# Patient Record
Sex: Female | Born: 1935 | Race: Black or African American | Hispanic: No | Smoking: Former smoker
Health system: Southern US, Community
[De-identification: ages and names within clinical notes are randomized; demographics above are authoritative.]

## PROBLEM LIST (undated history)

## (undated) DIAGNOSIS — K219 Gastro-esophageal reflux disease without esophagitis: Secondary | ICD-10-CM

## (undated) DIAGNOSIS — H409 Unspecified glaucoma: Secondary | ICD-10-CM

## (undated) DIAGNOSIS — I10 Essential (primary) hypertension: Secondary | ICD-10-CM

## (undated) DIAGNOSIS — I4891 Unspecified atrial fibrillation: Secondary | ICD-10-CM

## (undated) DIAGNOSIS — E119 Type 2 diabetes mellitus without complications: Secondary | ICD-10-CM

## (undated) HISTORY — PX: ABDOMINAL HYSTERECTOMY: SHX81

## (undated) HISTORY — DX: Type 2 diabetes mellitus without complications: E11.9

## (undated) HISTORY — PX: HEMORRHOID SURGERY: SHX153

---

## 2004-05-23 ENCOUNTER — Ambulatory Visit: Payer: Self-pay | Admitting: Internal Medicine

## 2005-09-11 ENCOUNTER — Ambulatory Visit: Payer: Self-pay | Admitting: Internal Medicine

## 2006-09-18 ENCOUNTER — Ambulatory Visit: Payer: Self-pay | Admitting: Internal Medicine

## 2008-02-19 ENCOUNTER — Ambulatory Visit: Payer: Self-pay | Admitting: Internal Medicine

## 2009-02-28 ENCOUNTER — Ambulatory Visit: Payer: Self-pay | Admitting: Internal Medicine

## 2009-04-28 ENCOUNTER — Ambulatory Visit: Payer: Self-pay | Admitting: Ophthalmology

## 2009-05-11 ENCOUNTER — Ambulatory Visit: Payer: Self-pay | Admitting: Cardiology

## 2009-05-11 ENCOUNTER — Ambulatory Visit: Payer: Self-pay | Admitting: Ophthalmology

## 2010-03-01 ENCOUNTER — Ambulatory Visit: Payer: Self-pay | Admitting: Internal Medicine

## 2011-03-05 ENCOUNTER — Ambulatory Visit: Payer: Self-pay | Admitting: Internal Medicine

## 2012-04-30 ENCOUNTER — Ambulatory Visit: Payer: Self-pay | Admitting: Internal Medicine

## 2012-12-31 ENCOUNTER — Encounter: Payer: Self-pay | Admitting: Podiatry

## 2012-12-31 ENCOUNTER — Ambulatory Visit (INDEPENDENT_AMBULATORY_CARE_PROVIDER_SITE_OTHER): Payer: Medicare Other | Admitting: Podiatry

## 2012-12-31 VITALS — BP 160/90 | HR 111 | Resp 16 | Ht 61.0 in | Wt 154.0 lb

## 2012-12-31 DIAGNOSIS — M79609 Pain in unspecified limb: Secondary | ICD-10-CM

## 2012-12-31 DIAGNOSIS — B351 Tinea unguium: Secondary | ICD-10-CM

## 2012-12-31 NOTE — Progress Notes (Signed)
Chief complaint of painful nails 1 through 5 bilateral.  Objective: Vital signs are stable alert and oriented x3. Nails are thick yellow dystrophic clinically mycotic. All reactive hyperkeratosis plantar aspect of the bilateral foot.  Assessment: Pain in limb secondary to onychomycosis porokeratotic lesions also noted.  Plan: Debridement of all reactive hyperkeratosis and debridement of nails 1 through 5 bilateral is cover service secondary to pain.

## 2013-03-30 ENCOUNTER — Encounter: Payer: Self-pay | Admitting: Podiatry

## 2013-03-30 ENCOUNTER — Ambulatory Visit (INDEPENDENT_AMBULATORY_CARE_PROVIDER_SITE_OTHER): Payer: Medicare Other | Admitting: Podiatry

## 2013-03-30 VITALS — BP 137/87 | HR 87 | Resp 18

## 2013-03-30 DIAGNOSIS — M79609 Pain in unspecified limb: Secondary | ICD-10-CM

## 2013-03-30 DIAGNOSIS — B351 Tinea unguium: Secondary | ICD-10-CM

## 2013-03-30 DIAGNOSIS — Q828 Other specified congenital malformations of skin: Secondary | ICD-10-CM

## 2013-03-30 DIAGNOSIS — E119 Type 2 diabetes mellitus without complications: Secondary | ICD-10-CM

## 2013-03-30 NOTE — Progress Notes (Signed)
Nails and callus on the right foot.  Objective: Vital signs are stable she is alert and oriented x3. Her nails are thick yellow dystrophic onychomycotic painful palpation as well as debridement she also has a porokeratotic lesion plantar aspect of the right foot. There is no overlying erythema edema saline is drainage or odor.  Assessment: Non-insulin-dependent diabetes mellitus with a history of porokeratotic lesion right foot. Pain in limb secondary to onychomycosis 1 through 5 bilateral.  Plan: Debridement of nails 1 through 5 bilateral covered service secondary to pain.

## 2013-04-01 ENCOUNTER — Ambulatory Visit: Payer: Medicare Other | Admitting: Podiatry

## 2013-06-29 ENCOUNTER — Ambulatory Visit (INDEPENDENT_AMBULATORY_CARE_PROVIDER_SITE_OTHER): Payer: Medicare Other | Admitting: Podiatry

## 2013-06-29 VITALS — BP 143/82 | HR 103 | Resp 16

## 2013-06-29 DIAGNOSIS — Q828 Other specified congenital malformations of skin: Secondary | ICD-10-CM

## 2013-06-29 DIAGNOSIS — M79609 Pain in unspecified limb: Secondary | ICD-10-CM

## 2013-06-29 DIAGNOSIS — E119 Type 2 diabetes mellitus without complications: Secondary | ICD-10-CM

## 2013-06-29 DIAGNOSIS — B351 Tinea unguium: Secondary | ICD-10-CM

## 2013-06-29 NOTE — Progress Notes (Signed)
She presents today chief complaint of painful elongated toenails and calluses bilateral.  Objective: Pulses are palpable bilateral. Reactive hyperkeratosis plantar aspect the bilateral foot nails are thick yellow dystrophic onychomycotic and painful palpation.  Assessment: Pain in limb secondary to onychomycosis and calluses.  Plan: Debridement of all reactive hyperkeratosis and debridement of nails 1 through 5 bilateral covered service secondary to pain.

## 2013-09-28 ENCOUNTER — Ambulatory Visit (INDEPENDENT_AMBULATORY_CARE_PROVIDER_SITE_OTHER): Payer: Medicare Other | Admitting: Podiatry

## 2013-09-28 DIAGNOSIS — B351 Tinea unguium: Secondary | ICD-10-CM

## 2013-09-28 DIAGNOSIS — M79676 Pain in unspecified toe(s): Secondary | ICD-10-CM

## 2013-09-28 DIAGNOSIS — E119 Type 2 diabetes mellitus without complications: Secondary | ICD-10-CM

## 2013-09-28 DIAGNOSIS — Q828 Other specified congenital malformations of skin: Secondary | ICD-10-CM

## 2013-09-28 DIAGNOSIS — M79609 Pain in unspecified limb: Secondary | ICD-10-CM

## 2013-09-28 NOTE — Progress Notes (Signed)
She presents today with a chief complaint of painful elongated toenails as well as corns and calluses. ° °Objective: Vital signs are stable she is alert and oriented x3. Nails are thick yellow dystrophic onychomycotic painful palpation. Thick porokeratotic lesions plantar aspect of the forefoot bilaterally. ° °Assessment: History of painful elongated nails 1 through 5 I lateral do to onychomycosis. And porokeratosis. ° °Plan: Debridement of nails in thickness and length as cover service secondary to pain. Debridement all reactive hyperkeratosis secondary to pain. °

## 2013-12-28 ENCOUNTER — Ambulatory Visit (INDEPENDENT_AMBULATORY_CARE_PROVIDER_SITE_OTHER): Payer: Medicare Other | Admitting: Podiatry

## 2013-12-28 ENCOUNTER — Ambulatory Visit: Payer: Medicare Other | Admitting: Podiatry

## 2013-12-28 DIAGNOSIS — B351 Tinea unguium: Secondary | ICD-10-CM

## 2013-12-28 DIAGNOSIS — M79676 Pain in unspecified toe(s): Secondary | ICD-10-CM

## 2013-12-28 NOTE — Progress Notes (Signed)
She presents today with a chief complaint of painful elongated toenails as well as corns and calluses.  Objective: Vital signs are stable she is alert and oriented x3. Nails are thick yellow dystrophic onychomycotic painful palpation. Thick porokeratotic lesions plantar aspect of the forefoot bilaterally.  Assessment: History of painful elongated nails 1 through 5 I lateral do to onychomycosis. And porokeratosis.  Plan: Debridement of nails in thickness and length as cover service secondary to pain. Debridement all reactive hyperkeratosis secondary to pain.

## 2014-02-26 ENCOUNTER — Ambulatory Visit: Payer: Self-pay | Admitting: Internal Medicine

## 2014-03-29 ENCOUNTER — Ambulatory Visit (INDEPENDENT_AMBULATORY_CARE_PROVIDER_SITE_OTHER): Payer: Medicare Other | Admitting: Podiatry

## 2014-03-29 ENCOUNTER — Other Ambulatory Visit: Payer: Medicare Other

## 2014-03-29 DIAGNOSIS — M79676 Pain in unspecified toe(s): Secondary | ICD-10-CM | POA: Diagnosis not present

## 2014-03-29 DIAGNOSIS — B351 Tinea unguium: Secondary | ICD-10-CM

## 2014-03-29 NOTE — Progress Notes (Signed)
Presents today chief complaint of painful elongated toenails.  Objective: Pulses are palpable bilateral nails are thick, yellow dystrophic onychomycosis and painful palpation.   Assessment: Onychomycosis with pain in limb.  Plan: Treatment of nails in thickness and length as covered service secondary to pain.  

## 2014-07-12 ENCOUNTER — Ambulatory Visit (INDEPENDENT_AMBULATORY_CARE_PROVIDER_SITE_OTHER): Payer: Medicare Other | Admitting: Podiatry

## 2014-07-12 DIAGNOSIS — M79676 Pain in unspecified toe(s): Secondary | ICD-10-CM

## 2014-07-12 DIAGNOSIS — B351 Tinea unguium: Secondary | ICD-10-CM | POA: Diagnosis not present

## 2014-07-12 DIAGNOSIS — Q828 Other specified congenital malformations of skin: Secondary | ICD-10-CM | POA: Diagnosis not present

## 2014-07-12 LAB — HM DIABETES FOOT EXAM

## 2014-07-12 NOTE — Progress Notes (Signed)
She presents today with a chief complaint of painful elongated toenails as well as corns and calluses.  Objective: Vital signs are stable she is alert and oriented x3. Nails are thick yellow dystrophic onychomycotic painful palpation. Thick porokeratotic lesions plantar aspect of the forefoot bilaterally.  Assessment: History of painful elongated nails 1 through 5 I lateral do to onychomycosis. And porokeratosis.  Plan: Debridement of nails in thickness and length as cover service secondary to pain. Debridement all reactive hyperkeratosis R sub 2 area secondary to pain.

## 2014-10-12 ENCOUNTER — Ambulatory Visit: Payer: Medicare Other

## 2014-10-18 ENCOUNTER — Ambulatory Visit (INDEPENDENT_AMBULATORY_CARE_PROVIDER_SITE_OTHER): Payer: Medicare Other | Admitting: Podiatry

## 2014-10-18 DIAGNOSIS — M79676 Pain in unspecified toe(s): Secondary | ICD-10-CM | POA: Diagnosis not present

## 2014-10-18 DIAGNOSIS — B351 Tinea unguium: Secondary | ICD-10-CM

## 2014-10-18 NOTE — Progress Notes (Signed)
She presents today with a chief complaint of painful elongated toenails as well as corns and calluses.  Objective: Vital signs are stable she is alert and oriented x3. Nails are thick yellow dystrophic onychomycotic painful palpation. Thick porokeratotic lesions plantar aspect of the forefoot bilaterally.  Assessment: History of painful elongated nails 1 through 5 I lateral do to onychomycosis. And porokeratosis.  Plan: Debridement of nails in thickness and length as cover service secondary to pain. Debridement all reactive hyperkeratosis R sub 2 area secondary to pain.  Arbutus Ped DPM

## 2015-01-19 ENCOUNTER — Encounter: Payer: Self-pay | Admitting: Podiatry

## 2015-01-19 ENCOUNTER — Ambulatory Visit (INDEPENDENT_AMBULATORY_CARE_PROVIDER_SITE_OTHER): Payer: Medicare Other | Admitting: Podiatry

## 2015-01-19 DIAGNOSIS — M79676 Pain in unspecified toe(s): Secondary | ICD-10-CM

## 2015-01-19 DIAGNOSIS — B351 Tinea unguium: Secondary | ICD-10-CM | POA: Diagnosis not present

## 2015-01-19 DIAGNOSIS — Q828 Other specified congenital malformations of skin: Secondary | ICD-10-CM | POA: Diagnosis not present

## 2015-01-19 NOTE — Progress Notes (Signed)
She presents today with a chief complaint painful elongated toenails.  Objective: Vital signs are stable alert and oriented 3. Pulses are strongly palpable. Toenails are thick yellow dystrophic, mycotic and painful palpation.  Assessment: Pain in limb secondary to onychomycosis and porokeratosis.  Plan: Debridement of all reactive hyperkeratotic tissue and debridement nails 1 through 5 bilateral.

## 2015-04-20 ENCOUNTER — Encounter: Payer: Self-pay | Admitting: Podiatry

## 2015-04-20 ENCOUNTER — Ambulatory Visit (INDEPENDENT_AMBULATORY_CARE_PROVIDER_SITE_OTHER): Payer: Medicare Other | Admitting: Podiatry

## 2015-04-20 DIAGNOSIS — B351 Tinea unguium: Secondary | ICD-10-CM

## 2015-04-20 DIAGNOSIS — M79676 Pain in unspecified toe(s): Secondary | ICD-10-CM

## 2015-04-20 DIAGNOSIS — Q828 Other specified congenital malformations of skin: Secondary | ICD-10-CM | POA: Diagnosis not present

## 2015-04-20 NOTE — Progress Notes (Signed)
She presents today for diabetic check. She states that her toenails and calluses are starting to get long and painful and she would like to have them cut.  Objective: Signs are stable she is alert and oriented 3. Pulses are palpable. Toenails are thick elongated and painful. Sharply incurvated nail margins. She has distal clavi in 4 keratomas bilateral.  Assessment: Porokeratosis bilateral. Pain in limb secondary to onychomycosis and diabetes.  Plan: Debridement of toenails and reactive hyperkeratosis bilateral. I will follow-up with her in 2-3 months.

## 2015-05-24 ENCOUNTER — Other Ambulatory Visit: Payer: Self-pay | Admitting: Internal Medicine

## 2015-05-24 DIAGNOSIS — Z1231 Encounter for screening mammogram for malignant neoplasm of breast: Secondary | ICD-10-CM

## 2015-06-02 ENCOUNTER — Ambulatory Visit
Admission: RE | Admit: 2015-06-02 | Discharge: 2015-06-02 | Disposition: A | Discharge status: Home / Self Care | Payer: Medicare Other | Source: Ambulatory Visit | Attending: Internal Medicine | Admitting: Internal Medicine

## 2015-06-02 ENCOUNTER — Other Ambulatory Visit: Payer: Self-pay | Admitting: Internal Medicine

## 2015-06-02 DIAGNOSIS — Z1231 Encounter for screening mammogram for malignant neoplasm of breast: Secondary | ICD-10-CM

## 2015-07-20 ENCOUNTER — Encounter: Payer: Self-pay | Admitting: Podiatry

## 2015-07-20 ENCOUNTER — Ambulatory Visit (INDEPENDENT_AMBULATORY_CARE_PROVIDER_SITE_OTHER): Payer: Medicare Other | Admitting: Podiatry

## 2015-07-20 DIAGNOSIS — M79676 Pain in unspecified toe(s): Secondary | ICD-10-CM

## 2015-07-20 DIAGNOSIS — B351 Tinea unguium: Secondary | ICD-10-CM

## 2015-07-20 DIAGNOSIS — Q828 Other specified congenital malformations of skin: Secondary | ICD-10-CM

## 2015-07-20 NOTE — Progress Notes (Signed)
She presents today with a chief complaint of a painful porokeratotic plantar aspect of the forefoot right as well as elongated thickened mycotic painful nails.  Objective: Vital signs are stable pulses are strong and palpable bilateral porokeratotic plantar aspect of the right foot is painful on palpation there is no open wound or lesion. Toenails are thick yellow dystrophic onychomycotic.  Assessment: Pain limb secondary to onychomycosis porokeratosis.  Plan: Debridement of toenails and porokeratosis.

## 2015-10-24 ENCOUNTER — Ambulatory Visit (INDEPENDENT_AMBULATORY_CARE_PROVIDER_SITE_OTHER): Payer: Medicare Other | Admitting: Podiatry

## 2015-10-24 ENCOUNTER — Encounter: Payer: Self-pay | Admitting: Podiatry

## 2015-10-24 DIAGNOSIS — Q828 Other specified congenital malformations of skin: Secondary | ICD-10-CM

## 2015-10-24 DIAGNOSIS — M79676 Pain in unspecified toe(s): Secondary | ICD-10-CM | POA: Diagnosis not present

## 2015-10-24 DIAGNOSIS — B351 Tinea unguium: Secondary | ICD-10-CM

## 2015-10-25 NOTE — Progress Notes (Signed)
She presents today with a chief complaint of painful elongated toenails and his history diabetes. She states she is doing really well. She is also complaining of painful calluses on aspect bilateral foot.  Objective: Vital signs are stable she is alert and oriented 3. Pulses are palpable. Neurologic sensorium is intact. Deep tendon reflexes are intact. Her toenails are thick yellow dystrophic mycotic and painful palpation. Multiple porokeratotic lesions plantar aspect of bilateral foot medial aspect of the hallux.  Assessment: Pain in limb secondary to onychomycosis and diabetes. Porokeratosis with no open lesions.  Plan: Debridement of toenails 1 through 5 bilateral. Follow up with her in 3 months. Debridement of all reactive hyperkeratotic tissue.

## 2016-01-25 ENCOUNTER — Encounter: Payer: Self-pay | Admitting: Podiatry

## 2016-01-25 ENCOUNTER — Ambulatory Visit (INDEPENDENT_AMBULATORY_CARE_PROVIDER_SITE_OTHER): Payer: Medicare Other | Admitting: Podiatry

## 2016-01-25 DIAGNOSIS — Q828 Other specified congenital malformations of skin: Secondary | ICD-10-CM

## 2016-01-25 DIAGNOSIS — B351 Tinea unguium: Secondary | ICD-10-CM | POA: Diagnosis not present

## 2016-01-25 DIAGNOSIS — M79676 Pain in unspecified toe(s): Secondary | ICD-10-CM

## 2016-01-25 NOTE — Progress Notes (Signed)
She presents today chief complaint of painful elongated toenails and corns and calluses.  Objective: Vital signs are stable she is alert and oriented 3. Pulses are palpable. Toenails are thick yellow dystrophic onychomycotic painful palpation. Multiple porokeratotic lesions plantar aspect of the bilateral foot. No open lesions or wounds.  Assessment: Pain in limb secondary to onychomycosis 1 through 5 bilateral.  Plan: Debridement of toenails 1 through 5 bilateral covered service secondary to pain debridement of all reactive hyperkeratotic tissue.

## 2016-04-25 ENCOUNTER — Ambulatory Visit: Payer: Medicare Other | Admitting: Podiatry

## 2016-04-30 ENCOUNTER — Ambulatory Visit: Payer: Medicare Other | Admitting: Podiatry

## 2016-10-23 ENCOUNTER — Other Ambulatory Visit: Payer: Self-pay | Admitting: Internal Medicine

## 2016-10-23 DIAGNOSIS — Z1231 Encounter for screening mammogram for malignant neoplasm of breast: Secondary | ICD-10-CM

## 2016-11-13 ENCOUNTER — Ambulatory Visit
Admission: RE | Admit: 2016-11-13 | Discharge: 2016-11-13 | Disposition: A | Discharge status: Home / Self Care | Payer: Medicare Other | Source: Ambulatory Visit | Attending: Internal Medicine | Admitting: Internal Medicine

## 2016-11-13 DIAGNOSIS — Z1231 Encounter for screening mammogram for malignant neoplasm of breast: Secondary | ICD-10-CM | POA: Diagnosis not present

## 2017-02-28 ENCOUNTER — Ambulatory Visit (INDEPENDENT_AMBULATORY_CARE_PROVIDER_SITE_OTHER): Payer: Medicare Other | Admitting: Podiatry

## 2017-02-28 ENCOUNTER — Encounter: Payer: Self-pay | Admitting: Podiatry

## 2017-02-28 DIAGNOSIS — B351 Tinea unguium: Secondary | ICD-10-CM | POA: Diagnosis not present

## 2017-02-28 DIAGNOSIS — Q828 Other specified congenital malformations of skin: Secondary | ICD-10-CM | POA: Diagnosis not present

## 2017-02-28 DIAGNOSIS — M79676 Pain in unspecified toe(s): Secondary | ICD-10-CM

## 2017-02-28 NOTE — Progress Notes (Addendum)
Complaint:  Visit Type: Patient returns to my office for continued preventative foot care services. Complaint: Patient states" my nails have grown long and thick and become painful to walk and wear shoes" Patient has been diagnosed with DM with no foot complications. The patient presents for preventative foot care services. No changes to ROS  Podiatric Exam: Vascular: dorsalis pedis and posterior tibial pulses are palpable bilateral. Capillary return is immediate. Temperature gradient is WNL. Skin turgor WNL  Sensorium: Normal Semmes Weinstein monofilament test. Normal tactile sensation bilaterally. Nail Exam: Pt has thick disfigured discolored nails with subungual debris noted bilateral entire nail hallux through fifth toenails Ulcer Exam: There is no evidence of ulcer or pre-ulcerative changes or infection. Orthopedic Exam: Muscle tone and strength are WNL. No limitations in general ROM. No crepitus or effusions noted. Foot type and digits show no abnormalities. HAV  B/L. Skin:  Porokeratosis sub 2 right.. No infection or ulcers  Diagnosis:  Onychomycosis, , Pain in right toe, pain in left toes  Treatment & Plan Procedures and Treatment: Consent by patient was obtained for treatment procedures.   Debridement of mycotic and hypertrophic toenails, 1 through 5 bilateral and clearing of subungual debris. No ulceration, no infection noted.  Return Visit-Office Procedure: Patient instructed to return to the office for a follow up visit 4 months for continued evaluation and treatment.    Helane GuntherGregory Siniyah Evangelist DPM

## 2017-07-01 ENCOUNTER — Ambulatory Visit: Payer: Medicare Other | Admitting: Podiatry

## 2018-01-28 ENCOUNTER — Other Ambulatory Visit: Payer: Self-pay | Admitting: Internal Medicine

## 2018-01-28 DIAGNOSIS — Z1231 Encounter for screening mammogram for malignant neoplasm of breast: Secondary | ICD-10-CM

## 2018-02-13 ENCOUNTER — Ambulatory Visit
Admission: RE | Admit: 2018-02-13 | Discharge: 2018-02-13 | Disposition: A | Discharge status: Home / Self Care | Payer: Medicare Other | Source: Ambulatory Visit | Attending: Internal Medicine | Admitting: Internal Medicine

## 2018-02-13 DIAGNOSIS — Z1231 Encounter for screening mammogram for malignant neoplasm of breast: Secondary | ICD-10-CM | POA: Diagnosis present

## 2020-06-03 ENCOUNTER — Inpatient Hospital Stay
Admission: EM | Admit: 2020-06-03 | Discharge: 2020-06-06 | DRG: 690 | Disposition: A | Discharge status: Skilled Nursing Facility | Payer: Medicare Other | Attending: Internal Medicine | Admitting: Internal Medicine

## 2020-06-03 ENCOUNTER — Other Ambulatory Visit: Payer: Self-pay

## 2020-06-03 DIAGNOSIS — E785 Hyperlipidemia, unspecified: Secondary | ICD-10-CM | POA: Diagnosis present

## 2020-06-03 DIAGNOSIS — R1310 Dysphagia, unspecified: Secondary | ICD-10-CM | POA: Diagnosis present

## 2020-06-03 DIAGNOSIS — D509 Iron deficiency anemia, unspecified: Secondary | ICD-10-CM | POA: Diagnosis present

## 2020-06-03 DIAGNOSIS — Z79899 Other long term (current) drug therapy: Secondary | ICD-10-CM

## 2020-06-03 DIAGNOSIS — Z66 Do not resuscitate: Secondary | ICD-10-CM | POA: Diagnosis present

## 2020-06-03 DIAGNOSIS — R531 Weakness: Secondary | ICD-10-CM | POA: Diagnosis not present

## 2020-06-03 DIAGNOSIS — N182 Chronic kidney disease, stage 2 (mild): Secondary | ICD-10-CM | POA: Diagnosis present

## 2020-06-03 DIAGNOSIS — I1310 Hypertensive heart and chronic kidney disease without heart failure, with stage 1 through stage 4 chronic kidney disease, or unspecified chronic kidney disease: Secondary | ICD-10-CM | POA: Diagnosis present

## 2020-06-03 DIAGNOSIS — I4510 Unspecified right bundle-branch block: Secondary | ICD-10-CM | POA: Diagnosis present

## 2020-06-03 DIAGNOSIS — E876 Hypokalemia: Secondary | ICD-10-CM | POA: Diagnosis present

## 2020-06-03 DIAGNOSIS — K219 Gastro-esophageal reflux disease without esophagitis: Secondary | ICD-10-CM | POA: Diagnosis present

## 2020-06-03 DIAGNOSIS — E1122 Type 2 diabetes mellitus with diabetic chronic kidney disease: Secondary | ICD-10-CM | POA: Diagnosis present

## 2020-06-03 DIAGNOSIS — R197 Diarrhea, unspecified: Secondary | ICD-10-CM | POA: Diagnosis present

## 2020-06-03 DIAGNOSIS — N179 Acute kidney failure, unspecified: Secondary | ICD-10-CM | POA: Diagnosis present

## 2020-06-03 DIAGNOSIS — N390 Urinary tract infection, site not specified: Secondary | ICD-10-CM | POA: Diagnosis not present

## 2020-06-03 DIAGNOSIS — E860 Dehydration: Secondary | ICD-10-CM | POA: Diagnosis present

## 2020-06-03 DIAGNOSIS — Z7984 Long term (current) use of oral hypoglycemic drugs: Secondary | ICD-10-CM

## 2020-06-03 DIAGNOSIS — Z20822 Contact with and (suspected) exposure to covid-19: Secondary | ICD-10-CM | POA: Diagnosis present

## 2020-06-03 DIAGNOSIS — H409 Unspecified glaucoma: Secondary | ICD-10-CM | POA: Diagnosis present

## 2020-06-03 DIAGNOSIS — Z90710 Acquired absence of both cervix and uterus: Secondary | ICD-10-CM

## 2020-06-03 DIAGNOSIS — I4891 Unspecified atrial fibrillation: Secondary | ICD-10-CM | POA: Diagnosis present

## 2020-06-03 DIAGNOSIS — R262 Difficulty in walking, not elsewhere classified: Secondary | ICD-10-CM | POA: Diagnosis present

## 2020-06-03 HISTORY — DX: Gastro-esophageal reflux disease without esophagitis: K21.9

## 2020-06-03 HISTORY — DX: Unspecified atrial fibrillation: I48.91

## 2020-06-03 HISTORY — DX: Essential (primary) hypertension: I10

## 2020-06-03 HISTORY — DX: Unspecified glaucoma: H40.9

## 2020-06-03 LAB — CBC
HCT: 27.6 % — ABNORMAL LOW (ref 36.0–46.0)
Hemoglobin: 9.6 g/dL — ABNORMAL LOW (ref 12.0–15.0)
MCH: 27.7 pg (ref 26.0–34.0)
MCHC: 34.8 g/dL (ref 30.0–36.0)
MCV: 79.5 fL — ABNORMAL LOW (ref 80.0–100.0)
Platelets: 280 10*3/uL (ref 150–400)
RBC: 3.47 MIL/uL — ABNORMAL LOW (ref 3.87–5.11)
RDW: 14.9 % (ref 11.5–15.5)
WBC: 11.9 10*3/uL — ABNORMAL HIGH (ref 4.0–10.5)
nRBC: 0 % (ref 0.0–0.2)

## 2020-06-03 LAB — URINALYSIS, COMPLETE (UACMP) WITH MICROSCOPIC
Bilirubin Urine: NEGATIVE
Glucose, UA: NEGATIVE mg/dL
Ketones, ur: NEGATIVE mg/dL
Nitrite: NEGATIVE
Protein, ur: NEGATIVE mg/dL
Specific Gravity, Urine: 1.013 (ref 1.005–1.030)
WBC, UA: >50 WBC/hpf — ABNORMAL HIGH (ref 0–5)
pH: 5 (ref 5.0–8.0)

## 2020-06-03 LAB — BASIC METABOLIC PANEL
Anion gap: 9 (ref 5–15)
BUN: 30 mg/dL — ABNORMAL HIGH (ref 8–23)
CO2: 25 mmol/L (ref 22–32)
Calcium: 9.1 mg/dL (ref 8.9–10.3)
Chloride: 107 mmol/L (ref 98–111)
Creatinine, Ser: 1.54 mg/dL — ABNORMAL HIGH (ref 0.44–1.00)
GFR, Estimated: 32 mL/min — ABNORMAL LOW (ref >60)
Glucose, Bld: 107 mg/dL — ABNORMAL HIGH (ref 70–99)
Potassium: 3.4 mmol/L — ABNORMAL LOW (ref 3.5–5.1)
Sodium: 141 mmol/L (ref 135–145)

## 2020-06-03 LAB — TROPONIN I (HIGH SENSITIVITY): Troponin I (High Sensitivity): 21 ng/L — ABNORMAL HIGH (ref <18)

## 2020-06-03 MED ORDER — CEPHALEXIN 500 MG PO CAPS: 500.0000 mg | ORAL_CAPSULE | Freq: Four times a day (QID) | ORAL | 0 refills | Status: DC

## 2020-06-03 MED ORDER — SODIUM CHLORIDE 0.9 % IV BOLUS
500.0000 mL | Freq: Once | INTRAVENOUS | Status: AC
  Administered 2020-06-03: 500 mL via INTRAVENOUS

## 2020-06-03 MED ORDER — SODIUM CHLORIDE 0.9 % IV SOLN
1.0000 g | Freq: Once | INTRAVENOUS | Status: AC
  Administered 2020-06-03: 1 g via INTRAVENOUS
  Filled 2020-06-03: qty 10

## 2020-06-03 NOTE — ED Notes (Signed)
Ex blue/red sent to lab. pericare provided for dried stool on buttocks and bilateral legs

## 2020-06-03 NOTE — ED Provider Notes (Signed)
[facility] Emergency Department Provider Note  ____________________________________________   I have reviewed the triage vital signs and the nursing notes.   HISTORY  Chief Complaint Weakness   History limited by: Not Limited   HPI Ebony Torres is a 87 y.o. female who presents to the emergency department today because of concern for weakness. The patient states that when she went to get out of bed today she was worried that she would fall. She has had weakness in the past and has had a fall roughly 3 months ago. Was not seen at that time. Son who is at bedside states that he feels she has not been doing well for a little while. States that she does not eat or drink enough. The patient denies any unilateral weakness. Denies any chest pain or shortness of breath. Denies any urinary changes or fever.     Records reviewed. Per medical record review patient has a history of atrial fibrillation, diabetes, GERD.   Past Medical History:  Diagnosis Date  . Atrial fibrillation (HCC)   . Diabetes (HCC)   . GERD (gastroesophageal reflux disease)   . Glaucoma   . Hypertension     There are no problems to display for this patient.   Past Surgical History:  Procedure Laterality Date  . ABDOMINAL HYSTERECTOMY    . HEMORRHOID SURGERY      Prior to Admission medications   Medication Sig Start Date End Date Taking? Authorizing Provider  metFORMIN (GLUMETZA) 1000 MG (MOD) 24 hr tablet Take 1,000 mg by mouth 2 (two) times daily with a meal.    [provider]  Multiple Vitamins-Minerals (MULTIVITAMIN PO) Take by mouth daily.    [provider]  Travoprost (TRAVATAN Z OP) Apply to eye. One drop at bedtime in each eye    [provider]    Allergies Patient has no known allergies.  Family History  Problem Relation Age of Onset  . Breast cancer Neg Hx     Social History Social History   Tobacco Use  . Smoking status: Never  Smoker  . Smokeless tobacco: Never Used  Substance Use Topics  . Alcohol use: No    Review of Systems Constitutional: Positive for generalized weakness.  Eyes: No visual changes. ENT: No sore throat. Cardiovascular: Denies chest pain. Respiratory: Denies shortness of breath. Gastrointestinal: No abdominal pain.  No nausea, no vomiting.  No diarrhea.   Genitourinary: Negative for dysuria. Musculoskeletal: Negative for back pain. Skin: Negative for rash. Neurological: Negative for headaches, focal weakness or numbness.  ____________________________________________   PHYSICAL EXAM:  VITAL SIGNS: ED Triage Vitals  Enc Vitals Group     BP 06/03/20 2003 (!) 161/78     Pulse Rate 06/03/20 2003 97     Resp 06/03/20 2003 16     Temp 06/03/20 2003 99.2 F (37.3 C)     Temp Source 06/03/20 2003 Oral     SpO2 06/03/20 2003 100 %     Weight 06/03/20 1959 154 lb 5.2 oz (70 kg)     Height 06/03/20 1959 5\' 1"  (1.549 m)     Head Circumference --      Peak Flow --      Pain Score 06/03/20 1958 0   Constitutional: Alert and oriented.  Eyes: Conjunctivae are normal.  ENT      Head: Normocephalic and atraumatic.      Nose: No congestion/rhinnorhea.      Mouth/Throat: Mucous membranes are moist.  Neck: No stridor. Hematological/Lymphatic/Immunilogical: No cervical lymphadenopathy. Cardiovascular: Normal rate, regular rhythm.  No murmurs, rubs, or gallops.  Respiratory: Normal respiratory effort without tachypnea nor retractions. Breath sounds are clear and equal bilaterally. No wheezes/rales/rhonchi. Gastrointestinal: Soft and non tender. No rebound. No guarding.  Genitourinary: Deferred Musculoskeletal: Normal range of motion in all extremities. No lower extremity edema. Neurologic:  Normal speech and language. No gross focal neurologic deficits are appreciated.  Skin:  Skin is warm, dry and intact. No rash noted. Psychiatric: Mood and affect are normal. Speech and behavior are  normal. Patient exhibits appropriate insight and judgment.  ____________________________________________    LABS (pertinent positives/negatives)  BMP na 141, k 3.4, glu 107, cr 1.54 CBC wbc 11.9, hgb 9.6, plt 280 Trop hs 21 UA cloudy, moderate hgb dipstick, leukocytes large, >50 wbc, many bacteria ____________________________________________   EKG  I, Phineas Semen, attending physician, personally viewed and interpreted this EKG  EKG Time: 2002 Rate: 95 Rhythm: sinus rhythm Axis: right axis deviation Intervals: qtc 503 QRS: RBBB ST changes: no st elevation Impression: abnormal ekg   ____________________________________________    RADIOLOGY  None  ____________________________________________   PROCEDURES  Procedures  ____________________________________________   INITIAL IMPRESSION / ASSESSMENT AND PLAN / ED COURSE  Pertinent labs & imaging results that were available during my care of the patient were reviewed by me and considered in my medical decision making (see chart for details).   Patient presented to the emergency department today because of concern for weakness. Work up here is concerning for urinary tract infection. Initial troponin was minimally elevated. EKG without concerning acute findings. Patient was given IVFs here in the emergency department and dose of IV abx. Will get repeat troponin and attempt ambulation after iv fluids.   ____________________________________________   FINAL CLINICAL IMPRESSION(S) / ED DIAGNOSES  Final diagnoses:  Weakness  Lower urinary tract infectious disease     Note: This dictation was prepared with Dragon dictation. Any transcriptional errors that result from this process are unintentional     Phineas Semen, MD 06/03/20 2334

## 2020-06-03 NOTE — ED Notes (Signed)
Ginger ale provided.

## 2020-06-03 NOTE — Discharge Instructions (Addendum)
Please seek medical attention for any high fevers, chest pain, shortness of breath, change in behavior, persistent vomiting, bloody stool or any other new or concerning symptoms.  

## 2020-06-03 NOTE — ED Triage Notes (Signed)
Pt c/o weakness x years, states was attempting to go to bathroom this morning and was too weak to make it. States has been to pmd couple months ago for eval and hasnt been told an eval. Pt denies pain. Was taken off diabetes med last year

## 2020-06-04 ENCOUNTER — Other Ambulatory Visit: Payer: Self-pay

## 2020-06-04 DIAGNOSIS — Z20822 Contact with and (suspected) exposure to covid-19: Secondary | ICD-10-CM | POA: Diagnosis present

## 2020-06-04 DIAGNOSIS — Z79899 Other long term (current) drug therapy: Secondary | ICD-10-CM | POA: Diagnosis not present

## 2020-06-04 DIAGNOSIS — R531 Weakness: Secondary | ICD-10-CM

## 2020-06-04 DIAGNOSIS — Z90710 Acquired absence of both cervix and uterus: Secondary | ICD-10-CM | POA: Diagnosis not present

## 2020-06-04 DIAGNOSIS — N390 Urinary tract infection, site not specified: Secondary | ICD-10-CM | POA: Diagnosis present

## 2020-06-04 DIAGNOSIS — E785 Hyperlipidemia, unspecified: Secondary | ICD-10-CM | POA: Diagnosis present

## 2020-06-04 DIAGNOSIS — H409 Unspecified glaucoma: Secondary | ICD-10-CM | POA: Diagnosis present

## 2020-06-04 DIAGNOSIS — Z7984 Long term (current) use of oral hypoglycemic drugs: Secondary | ICD-10-CM | POA: Diagnosis not present

## 2020-06-04 DIAGNOSIS — I4891 Unspecified atrial fibrillation: Secondary | ICD-10-CM | POA: Diagnosis present

## 2020-06-04 DIAGNOSIS — N189 Chronic kidney disease, unspecified: Secondary | ICD-10-CM

## 2020-06-04 DIAGNOSIS — R1310 Dysphagia, unspecified: Secondary | ICD-10-CM | POA: Diagnosis present

## 2020-06-04 DIAGNOSIS — R262 Difficulty in walking, not elsewhere classified: Secondary | ICD-10-CM | POA: Diagnosis not present

## 2020-06-04 DIAGNOSIS — E876 Hypokalemia: Secondary | ICD-10-CM

## 2020-06-04 DIAGNOSIS — Z66 Do not resuscitate: Secondary | ICD-10-CM | POA: Diagnosis present

## 2020-06-04 DIAGNOSIS — N179 Acute kidney failure, unspecified: Secondary | ICD-10-CM | POA: Diagnosis present

## 2020-06-04 DIAGNOSIS — N182 Chronic kidney disease, stage 2 (mild): Secondary | ICD-10-CM | POA: Diagnosis present

## 2020-06-04 DIAGNOSIS — E860 Dehydration: Secondary | ICD-10-CM | POA: Diagnosis present

## 2020-06-04 DIAGNOSIS — K219 Gastro-esophageal reflux disease without esophagitis: Secondary | ICD-10-CM | POA: Diagnosis present

## 2020-06-04 DIAGNOSIS — R197 Diarrhea, unspecified: Secondary | ICD-10-CM | POA: Diagnosis present

## 2020-06-04 DIAGNOSIS — I1310 Hypertensive heart and chronic kidney disease without heart failure, with stage 1 through stage 4 chronic kidney disease, or unspecified chronic kidney disease: Secondary | ICD-10-CM | POA: Diagnosis present

## 2020-06-04 DIAGNOSIS — E1122 Type 2 diabetes mellitus with diabetic chronic kidney disease: Secondary | ICD-10-CM | POA: Diagnosis present

## 2020-06-04 DIAGNOSIS — I4510 Unspecified right bundle-branch block: Secondary | ICD-10-CM | POA: Diagnosis present

## 2020-06-04 DIAGNOSIS — D509 Iron deficiency anemia, unspecified: Secondary | ICD-10-CM | POA: Diagnosis present

## 2020-06-04 LAB — GLUCOSE, CAPILLARY
Glucose-Capillary: 103 mg/dL — ABNORMAL HIGH (ref 70–99)
Glucose-Capillary: 86 mg/dL (ref 70–99)
Glucose-Capillary: 90 mg/dL (ref 70–99)
Glucose-Capillary: 149 mg/dL — ABNORMAL HIGH (ref 70–99)

## 2020-06-04 LAB — BASIC METABOLIC PANEL
Anion gap: 7 (ref 5–15)
BUN: 24 mg/dL — ABNORMAL HIGH (ref 8–23)
CO2: 24 mmol/L (ref 22–32)
Calcium: 8.6 mg/dL — ABNORMAL LOW (ref 8.9–10.3)
Chloride: 110 mmol/L (ref 98–111)
Creatinine, Ser: 1.23 mg/dL — ABNORMAL HIGH (ref 0.44–1.00)
GFR, Estimated: 43 mL/min — ABNORMAL LOW (ref >60)
Glucose, Bld: 107 mg/dL — ABNORMAL HIGH (ref 70–99)
Potassium: 3.9 mmol/L (ref 3.5–5.1)
Sodium: 141 mmol/L (ref 135–145)

## 2020-06-04 LAB — MAGNESIUM: Magnesium: 1.8 mg/dL (ref 1.7–2.4)

## 2020-06-04 LAB — CBC
HCT: 26.5 % — ABNORMAL LOW (ref 36.0–46.0)
Hemoglobin: 9 g/dL — ABNORMAL LOW (ref 12.0–15.0)
MCH: 27.4 pg (ref 26.0–34.0)
MCHC: 34 g/dL (ref 30.0–36.0)
MCV: 80.8 fL (ref 80.0–100.0)
Platelets: 246 10*3/uL (ref 150–400)
RBC: 3.28 MIL/uL — ABNORMAL LOW (ref 3.87–5.11)
RDW: 15 % (ref 11.5–15.5)
WBC: 10.4 10*3/uL (ref 4.0–10.5)
nRBC: 0 % (ref 0.0–0.2)

## 2020-06-04 LAB — RESP PANEL BY RT-PCR (FLU A&B, COVID) ARPGX2
Influenza A by PCR: NEGATIVE
Influenza B by PCR: NEGATIVE
SARS Coronavirus 2 by RT PCR: NEGATIVE

## 2020-06-04 LAB — HEMOGLOBIN A1C
Hgb A1c MFr Bld: 6.1 % — ABNORMAL HIGH (ref 4.8–5.6)
Mean Plasma Glucose: 128.37 mg/dL

## 2020-06-04 LAB — TROPONIN I (HIGH SENSITIVITY): Troponin I (High Sensitivity): 18 ng/L — ABNORMAL HIGH (ref <18)

## 2020-06-04 MED ORDER — ONDANSETRON HCL 4 MG PO TABS: 4.0000 mg | ORAL_TABLET | Freq: Four times a day (QID) | ORAL | Status: DC | PRN

## 2020-06-04 MED ORDER — SODIUM CHLORIDE 0.9 % IV SOLN
1.0000 g | INTRAVENOUS | Status: DC
  Administered 2020-06-04 – 2020-06-05 (×2): 1 g via INTRAVENOUS
  Filled 2020-06-04 (×2): qty 1
  Filled 2020-06-04: qty 10

## 2020-06-04 MED ORDER — ONDANSETRON HCL 4 MG/2ML IJ SOLN: 4.0000 mg | Freq: Four times a day (QID) | INTRAMUSCULAR | Status: DC | PRN

## 2020-06-04 MED ORDER — TRAVOPROST (BAK FREE) 0.004 % OP SOLN
1.0000 [drp] | Freq: Every day | OPHTHALMIC | Status: DC
  Administered 2020-06-04 – 2020-06-05 (×3): 1 [drp] via OPHTHALMIC
  Filled 2020-06-04: qty 2.5

## 2020-06-04 MED ORDER — ENSURE ENLIVE PO LIQD
237.0000 mL | Freq: Two times a day (BID) | ORAL | Status: DC
  Administered 2020-06-04 – 2020-06-06 (×5): 237 mL via ORAL

## 2020-06-04 MED ORDER — ENOXAPARIN SODIUM 30 MG/0.3ML IJ SOSY
30.0000 mg | PREFILLED_SYRINGE | INTRAMUSCULAR | Status: DC
  Administered 2020-06-04 – 2020-06-06 (×3): 30 mg via SUBCUTANEOUS
  Filled 2020-06-04 (×3): qty 0.3

## 2020-06-04 MED ORDER — ORAL CARE MOUTH RINSE
15.0000 mL | Freq: Two times a day (BID) | OROMUCOSAL | Status: DC
  Administered 2020-06-04 – 2020-06-06 (×6): 15 mL via OROMUCOSAL

## 2020-06-04 MED ORDER — POTASSIUM CHLORIDE CRYS ER 20 MEQ PO TBCR
40.0000 meq | EXTENDED_RELEASE_TABLET | Freq: Once | ORAL | Status: AC
  Administered 2020-06-04: 02:00:00 40 meq via ORAL
  Filled 2020-06-04: qty 2

## 2020-06-04 MED ORDER — LOPERAMIDE HCL 2 MG PO CAPS
2.0000 mg | ORAL_CAPSULE | Freq: Once | ORAL | Status: AC
  Administered 2020-06-04: 2 mg via ORAL
  Filled 2020-06-04: qty 1

## 2020-06-04 MED ORDER — INSULIN ASPART 100 UNIT/ML IJ SOLN
0.0000 [IU] | Freq: Three times a day (TID) | INTRAMUSCULAR | Status: DC
  Administered 2020-06-04: 1 [IU] via SUBCUTANEOUS
  Administered 2020-06-05: 2 [IU] via SUBCUTANEOUS
  Administered 2020-06-05: 1 [IU] via SUBCUTANEOUS
  Administered 2020-06-06 (×2): 2 [IU] via SUBCUTANEOUS
  Filled 2020-06-04 (×5): qty 1

## 2020-06-04 MED ORDER — ACETAMINOPHEN 650 MG RE SUPP: 650.0000 mg | Freq: Four times a day (QID) | RECTAL | Status: DC | PRN

## 2020-06-04 MED ORDER — LOPERAMIDE HCL 2 MG PO CAPS: 2.0000 mg | ORAL_CAPSULE | ORAL | Status: DC | PRN

## 2020-06-04 MED ORDER — TRAZODONE HCL 50 MG PO TABS: 25.0000 mg | ORAL_TABLET | Freq: Every evening | ORAL | Status: DC | PRN

## 2020-06-04 MED ORDER — ADULT MULTIVITAMIN W/MINERALS CH
ORAL_TABLET | Freq: Every day | ORAL | Status: DC
  Administered 2020-06-04 – 2020-06-06 (×3): 1 via ORAL
  Filled 2020-06-04 (×3): qty 1

## 2020-06-04 MED ORDER — SODIUM CHLORIDE 0.9 % IV SOLN: INTRAVENOUS | Status: DC

## 2020-06-04 MED ORDER — ACETAMINOPHEN 325 MG PO TABS: 650.0000 mg | ORAL_TABLET | Freq: Four times a day (QID) | ORAL | Status: DC | PRN

## 2020-06-04 MED ORDER — MAGNESIUM HYDROXIDE 400 MG/5ML PO SUSP
30.0000 mL | Freq: Every day | ORAL | Status: DC | PRN
  Filled 2020-06-04: qty 30

## 2020-06-04 MED ORDER — RISAQUAD PO CAPS
2.0000 | ORAL_CAPSULE | Freq: Every day | ORAL | Status: DC
  Administered 2020-06-05 – 2020-06-06 (×2): 2 via ORAL
  Filled 2020-06-04 (×2): qty 2

## 2020-06-04 NOTE — H&P (Addendum)
[facility]   PATIENT NAME: Ebony Torres    MR#:  [id number]  DATE OF BIRTH:  [birth year]  DATE OF ADMISSION:  06/03/2020  PRIMARY CARE PHYSICIAN: Ebony Arbour, MD   Patient is coming from: Home  REQUESTING/REFERRING PHYSICIAN: Minna Antis, MD CHIEF COMPLAINT:   Chief Complaint  Patient presents with  . Weakness    HISTORY OF PRESENT ILLNESS:  Ebony Torres is a 87 y.o. African-American female with medical history significant for type 2 diabetes mellitus, hypertension, dyslipidemia, atrial fibrillation, GERD and glaucoma, who presented to the emergency room with acute onset of generalized weakness that started today to the point that she was not able to ambulate.  She has been having occasional cough without wheezing or dyspnea.  She admitted to lower abdominal suprapubic pain without nausea vomiting or diarrhea. No significant urinary symptoms including urinary frequency urge  No headache or dizziness or blurred vision.ncy or dysuria or oliguria or flank pain.  No chest pain or palpitations.  ED Course: Upon presentation to the ER blood pressure was 161/78 with a heart rate of 101 and otherwise normal vital signs.  Labs revealed cytosis of 11.9 and mild hypokalemia 3.4 with a BUN of 30 and creatinine 1.54.  UA was positive for UTI.  Respiratory panel is currently pending. EKG as reviewed by me : Showed sinus rhythm with rate of 95 with probable left atrial enlargement and T wave inversion in V2 and V3 with right bundle branch block.  The patient was given a gram of IV Rocephin and 500 mL IV normal saline bolus.  She was still unable to ambulate with assistance after treatment but should be admitted to a medical bed for further evaluation and management. PAST MEDICAL HISTORY:   Past Medical History:  Diagnosis Date  . Atrial fibrillation (HCC)   . Diabetes (HCC)   . GERD (gastroesophageal reflux disease)   . Glaucoma   . Hypertension     PAST SURGICAL HISTORY:    Past Surgical History:  Procedure Laterality Date  . ABDOMINAL HYSTERECTOMY    . HEMORRHOID SURGERY      SOCIAL HISTORY:   Social History   Tobacco Use  . Smoking status: Never Smoker  . Smokeless tobacco: Never Used  Substance Use Topics  . Alcohol use: No    FAMILY HISTORY:   Family History  Problem Relation Age of Onset  . Breast cancer Neg Hx     DRUG ALLERGIES:  No Known Allergies  REVIEW OF SYSTEMS:   ROS As per history of present illness. All pertinent systems were reviewed above. Constitutional, HEENT, cardiovascular, respiratory, GI, GU, musculoskeletal, neuro, psychiatric, endocrine, integumentary and hematologic systems were reviewed and are otherwise negative/unremarkable except for positive findings mentioned above in the HPI.   MEDICATIONS AT HOME:   Prior to Admission medications   Medication Sig Start Date End Date Taking? Authorizing Provider  cephALEXin (KEFLEX) 500 MG capsule Take 1 capsule (500 mg total) by mouth 4 (four) times daily for 10 days. 06/03/20 06/13/20 Yes Ebony Semen, MD  metFORMIN (GLUMETZA) 1000 MG (MOD) 24 hr tablet Take 1,000 mg by mouth 2 (two) times daily with a meal.    [provider]  Multiple Vitamins-Minerals (MULTIVITAMIN PO) Take by mouth daily.    [provider]  Travoprost (TRAVATAN Z OP) Apply to eye. One drop at bedtime in each eye    [provider]      VITAL SIGNS:  Blood pressure Marland Kitchen)  150/77, pulse 88, temperature 99.2 F (37.3 C), temperature source Oral, resp. rate 20, height 5\' 1"  (1.549 m), weight 70 kg, SpO2 97 %.  PHYSICAL EXAMINATION:  Physical Exam  GENERAL:  87 y.o.-year-old African-American female patient lying in the bed with no acute distress.  EYES: Pupils equal, round, reactive to light and accommodation. No scleral icterus. Extraocular muscles intact.  HEENT: Head atraumatic, normocephalic. Oropharynx and nasopharynx clear.  NECK:  Supple, no jugular venous  distention. No thyroid enlargement, no tenderness.  LUNGS: Normal breath sounds bilaterally, no wheezing, rales,rhonchi or crepitation. No use of accessory muscles of respiration.  CARDIOVASCULAR: Regular rate and rhythm, S1, S2 normal. No murmurs, rubs, or gallops.  ABDOMEN: Soft, nondistended, with mild suprapubic tenderness without rebound tenderness guarding or rigidity.. Bowel sounds present. No organomegaly or mass.  EXTREMITIES: No pedal edema, cyanosis, or clubbing.  NEUROLOGIC: Cranial nerves II through XII are intact. Muscle strength 5/5 in all extremities. Sensation intact. Gait not checked.  PSYCHIATRIC: The patient is alert and oriented x 3.  Normal affect and good eye contact. SKIN: No obvious rash, lesion, or ulcer.   LABORATORY PANEL:   CBC Recent Labs  Lab 06/03/20 2004  WBC 11.9*  HGB 9.6*  HCT 27.6*  PLT 280   ------------------------------------------------------------------------------------------------------------------  Chemistries  Recent Labs  Lab 06/03/20 2004  NA 141  K 3.4*  CL 107  CO2 25  GLUCOSE 107*  BUN 30*  CREATININE 1.54*  CALCIUM 9.1   ------------------------------------------------------------------------------------------------------------------  Cardiac Enzymes No results for input(s): TROPONINI in the last 168 hours. ------------------------------------------------------------------------------------------------------------------  RADIOLOGY:  No results found.    IMPRESSION AND PLAN:  Active Problems:   Unable to walk  1.  Generalized weakness with inability to ambulate likely secondary to UTI and dehydration. - The patient will be admitted to a medical bed. - We will continue IV antibiotic therapy with Rocephin. - We will follow urine culture and sensitivity. - The patient will be hydrated with IV normal saline. - Physical therapy consult will be obtained.  2.  Acute kidney injury superimposed on stage II chronic  kidney disease likely prerenal secondary to volume depletion and dehydration. - The patient will be hydrated with IV normal saline and will follow her BMP. - We will avoid nephrotoxins.  3.  Hypokalemia. - Test will be replaced and magnesium level be checked.  4.  Hypertension. - We will hold off her Hyzaar given acute kidney injury and continue her Toprol-XL.  5.  Type 2 diabetes mellitus. - The patient will be placed on supplemental coverage with NovoLog. - We will hold off metformin given acute kidney injury.  6.  Glaucoma. - Ophthalmic GTTs will be continued.  7.  GERD. - We will continue PPI therapy.  8.  Dyslipidemia. - We will continue her statin therapy.  DVT prophylaxis: Lovenox. Code Status: The patient is DNR/DNI.  This was discussed with her. Family Communication:  The plan of care was discussed in details with the patient (and family). I answered all questions. The patient agreed to proceed with the above mentioned plan. Further management will depend upon hospital course. Disposition Plan: Back to previous home environment Consults called: none. All the records are reviewed and case discussed with ED provider.  Status is: Inpatient  Remains inpatient appropriate because:Ongoing diagnostic testing needed not appropriate for outpatient work up, Unsafe d/c plan, IV treatments appropriate due to intensity of illness or inability to take PO and Inpatient level of care appropriate due to severity of  illness   Dispo: The patient is from: Home              Anticipated d/c is to: Home              Patient currently is not medically stable to d/c.   Difficult to place patient No  TOTAL TIME TAKING CARE OF THIS PATIENT: 55 minutes.    Hannah Beat M.D on 06/04/2020 at 12:37 AM  [facility]   From 7 PM-7 AM, contact night-coverage [email or web address]  CC: Primary care physician; Ebony Arbour, MD

## 2020-06-04 NOTE — Evaluation (Signed)
Occupational Therapy Evaluation Patient Details Name: Ebony Torres MRN: [id number] DOB: [birth year] Today's Date: 06/04/2020    History of Present Illness Pt is an 87 y.o. African-American female with medical history significant for type 2 diabetes mellitus, hypertension, dyslipidemia, atrial fibrillation, GERD and glaucoma, who presented to the emergency room with acute onset of generalized weakness, workup indicating UTI and dehydration.   Clinical Impression   Pt seen for OT evaluation this date in setting of acute hospitalization d/t weakness and decreased ability to walk. Pt lives with son, Ebony Torres in [facility] and was walking w/o device, but endorses increasing difficulty and at least one fall in past 6 months. Pt reports that she only gets OOB 3-4x/day to walk to restroom or to eat meals and is otherwise in bed. Pt presents this date with visible muscle atophy, general deconditioning, and decreased functional activity tolerance, impacting her ability to safely and efficiently perform ADLs/ADL mobility. On assessment this date, pt requires MOD/MAX A with ADL transfers with arm in arm/hand held assist technique. Pt with multiple bouts of bowel and urine incontinence with loose stools requriing peri care with MAX to TOTAL assist x3-4 during session. Pt assisted with in-bed rolling with MOD/MAX A to get onto clean bedding. Pt left with all needs met and in reach. Extended time spent educating pt and her son re: importance of OOB activity and increasing activity to reach her goals. OT demos bed-level exercises including SLR/SAR to begin to increase ability to tolerate gravity resistance. Pt benefits from skilled OT to increase strength as it applies to safety completing self care and reducing risk for caregiver burden. Will continue to follow acutely. Anticipate pt will require STR in SNF setting upon d/c from hospital.     Follow Up Recommendations  SNF    Equipment Recommendations  3 in 1 bedside  commode;Tub/shower seat;Other (comment) (2ww, w/c)    Recommendations for Other Services       Precautions / Restrictions Precautions Precautions: Fall Restrictions Weight Bearing Restrictions: No      Mobility Bed Mobility Overal bed mobility: Needs Assistance Bed Mobility: Sit to Supine       Sit to supine: Max assist   General bed mobility comments: manage trunk and LEs    Transfers Overall transfer level: Needs assistance Equipment used: Rolling walker (2 wheeled);1 person hand held assist Transfers: Stand Pivot Transfers   Stand pivot transfers: Mod assist;Max assist       General transfer comment: increased time, assist, cues for hand placement, attempted to use walker, but ultimately just helped pt with hand held assist to [facility] as her UEs are not strong enough to benefit from walker support.    Balance Overall balance assessment: Needs assistance Sitting-balance support: Feet supported;Bilateral upper extremity supported Sitting balance-Leahy Scale: Poor   Postural control: Posterior lean   Standing balance-Leahy Scale: Poor                             ADL either performed or assessed with clinical judgement   ADL Overall ADL's : Needs assistance/impaired                                       General ADL Comments: MIN A for seated UB ADLs, MAX A for seated/standing/bed level LB ADLs including dressing and posterior peri care. MOD A for transfers  Vision Baseline Vision/History: Wears glasses Wears Glasses: At all times Patient Visual Report: No change from baseline       Perception     Praxis      Pertinent Vitals/Pain Pain Assessment: No/denies pain     Hand Dominance     Extremity/Trunk Assessment Upper Extremity Assessment Upper Extremity Assessment: Generalized weakness   Lower Extremity Assessment Lower Extremity Assessment: Generalized weakness   Cervical / Trunk Assessment Cervical / Trunk  Assessment: Kyphotic   Communication Communication Communication: No difficulties;Other (comment) (hypophonic)   Cognition Arousal/Alertness: Awake/alert Behavior During Therapy: WFL for tasks assessed/performed Overall Cognitive Status: Within Functional Limits for tasks assessed                                     General Comments       Exercises Other Exercises Other Exercises: Extended time spent on bed mobility/transfer back to bed from chair, to address BM/pericare. OT educates re: role of OT in acute setting. Importance of OOB activity and increasing activity levels. OT educates pt on elevating LEs/UEs off bed against gravity to begin to increase the strength necessary to complete ADLs/ADL mobility more safely.   Shoulder Instructions      Home Living Family/patient expects to be discharged to:: Private residence Living Arrangements: Children Available Help at Discharge: Available PRN/intermittently;Other (Comment) (son is gone for 12 hrs or so during the day usually for work/errands) Type of Home: House Home Access: Stairs to enter [employer] of Steps: 2 Entrance Stairs-Rails: Left Home Layout: One level     Bathroom Shower/Tub: Walk-in shower (with lip to step over)   Bathroom Toilet: Handicapped height     Home Equipment: Environmental consultant - 2 wheels;Bedside commode;Cane - single point          Prior Functioning/Environment Level of Independence: Needs assistance  Gait / Transfers Assistance Needed: amb w/o device, but reports that she typically only gets OOB 3-4x/day to get to restroom/eat. Otherwise is mostly laying in the bed. Does not go out into the community. ADL's / Homemaking Assistance Needed: Completely INDEP inclduing driving in [id number], son and pt describe gradual decline since then. Pt was performing most self care I'ly/MOD I recently, but endorses struggling greatly. Pt requires extensive assist from her son for all IADLs including  driving, errands, etc.   Comments: reports 1 fall 6MA        OT Problem List: Decreased strength;Decreased range of motion;Decreased activity tolerance;Impaired balance (sitting and/or standing);Decreased knowledge of use of DME or AE      OT Treatment/Interventions: Self-care/ADL training;Therapeutic exercise;Energy conservation;DME and/or AE instruction;Therapeutic activities;Balance training;Patient/family education    OT Goals(Current goals can be found in the care plan section) Acute Rehab OT Goals Patient Stated Goal: to get stronger, be able to fly to visit my daughters in [place] eventually OT Goal Formulation: With patient Time For Goal Achievement: 06/18/20 Potential to Achieve Goals: Fair ADL Goals Pt Will Perform Lower Body Dressing: with min assist;sit to/from stand Pt Will Transfer to Toilet: with min assist;stand pivot transfer;bedside commode Pt Will Perform Toileting - Clothing Manipulation and hygiene: with min assist;sit to/from stand Pt/caregiver will Perform Home Exercise Program: Increased strength;Both right and left upper extremity;With minimal assist  OT Frequency: Min 1X/week   Barriers to D/C:            Co-evaluation  AM-PAC OT "6 Clicks" Daily Activity     Outcome Measure Help from another person eating meals?: A Little Help from another person taking care of personal grooming?: A Little Help from another person toileting, which includes using toliet, bedpan, or urinal?: Total Help from another person bathing (including washing, rinsing, drying)?: Total Help from another person to put on and taking off regular upper body clothing?: A Lot Help from another person to put on and taking off regular lower body clothing?: Total 6 Click Score: 11   End of Session Equipment Utilized During Treatment: Gait belt;Rolling walker Nurse Communication: Mobility status  Activity Tolerance: Patient tolerated treatment well Patient left: in  bed;with call bell/phone within reach;with bed alarm set;with family/visitor present  OT Visit Diagnosis: Unsteadiness on feet (R26.81);Muscle weakness (generalized) (M62.81);Adult, failure to thrive (R62.7)                Time: 7354-3014 OT Time Calculation (min): 64 min Charges:  OT General Charges $OT Visit: 1 Visit OT Evaluation $OT Eval Moderate Complexity: 1 Mod OT Treatments $Self Care/Home Management : 23-37 mins $Therapeutic Activity: 23-37 mins  Gerrianne Scale, MS, OTR/L ascom [phone] 06/04/20, 4:42 PM

## 2020-06-04 NOTE — Evaluation (Signed)
Physical Therapy Evaluation Patient Details Name: Ebony Torres MRN: [id number] DOB: 1930/12/17 Today's Date: 06/04/2020   History of Present Illness  Pt is an 87 y.o. African-American female with medical history significant for type 2 diabetes mellitus, hypertension, dyslipidemia, atrial fibrillation, GERD and glaucoma, who presented to the emergency room with acute onset of generalized weakness, workup indicating UTI and dehydration.    Clinical Impression  Pt alert, agreeable to PT, oriented x4 with family at bedside. The patient reported at baseline she is ambulatory without AD, able to perform ADLs, lives with her son who works full time. 1 fall in the last 6 months.  Time spent on bed mobility to address BM. Pt able to roll L and R with minA. Supine to sit maxA, pt able to initiate. Stand pivot performed twice, maxA, and able to transfer to recliner on second attempt. Pt cued for hand placement, and able to reach for chair to assist with stand to sit. Pt reported fatigue, repositioned for comfort.  Pt and family educated on importance of continued mobility and OOB activities daily, verbalized understanding. Overall the patient demonstrated deficits (see "PT Problem List") that impede the patient's functional abilities, safety, and mobility and would benefit from skilled PT intervention. Recommendation is SNF due to current level of assistance needed and decline in functional mobility compared to PLOF.      Follow Up Recommendations SNF    Equipment Recommendations  Other (comment) (TBD)    Recommendations for Other Services OT consult     Precautions / Restrictions Precautions Precautions: Fall Restrictions Weight Bearing Restrictions: No      Mobility  Bed Mobility Overal bed mobility: Needs Assistance Bed Mobility: Rolling;Supine to Sit Rolling: Min assist   Supine to sit: Max assist;HOB elevated          Transfers Overall transfer level: Needs assistance    Transfers: Stand Pivot Transfers   Stand pivot transfers: Max assist       General transfer comment: Pt able to assist ~25% with time and cues  Ambulation/Gait             General Gait Details: deferred due to pt fatigue/weakness  Stairs            Wheelchair Mobility    Modified Rankin (Stroke Patients Only)       Balance Overall balance assessment: Needs assistance Sitting-balance support: Feet supported;Bilateral upper extremity supported Sitting balance-Leahy Scale: Poor   Postural control: Posterior lean   Standing balance-Leahy Scale: Poor                               Pertinent Vitals/Pain Pain Assessment: No/denies pain    Home Living Family/patient expects to be discharged to:: Private residence Living Arrangements: Children Available Help at Discharge: Available PRN/intermittently;Other (Comment) (son is gone for 12 hrs or so during the day usually for work/errands) Type of Home: House Home Access: Stairs to enter Entrance Stairs-Rails: Left Entrance Stairs-Number of Steps: 2 Home Layout: One level Home Equipment: Walker - 2 wheels;Bedside commode;Cane - single point      Prior Function Level of Independence: Independent with assistive device(s)         Comments: previously I for ADLs, ambulates without device. reported 1 fall about 6 months ago, stated her legs just gave out on her. Son performs IADLs as needed     Hand Dominance        Extremity/Trunk Assessment  Upper Extremity Assessment Upper Extremity Assessment: Generalized weakness    Lower Extremity Assessment Lower Extremity Assessment: Generalized weakness (very minimal ability to lift LEs against gravity)    Cervical / Trunk Assessment Cervical / Trunk Assessment: Kyphotic  Communication   Communication: No difficulties;Other (comment) (soft spoken)  Cognition Arousal/Alertness: Awake/alert Behavior During Therapy: WFL for tasks  assessed/performed Overall Cognitive Status: Within Functional Limits for tasks assessed                                        General Comments      Exercises Other Exercises Other Exercises: Extended time spent on bed mobility to address BM/pericare.   Assessment/Plan    PT Assessment Patient needs continued PT services  PT Problem List Decreased strength;Decreased mobility;Decreased range of motion;Decreased activity tolerance;Decreased balance;Decreased knowledge of use of DME       PT Treatment Interventions DME instruction;Therapeutic exercise;Gait training;Balance training;Stair training;Neuromuscular re-education;Functional mobility training;Therapeutic activities;Patient/family education    PT Goals (Current goals can be found in the Care Plan section)  Acute Rehab PT Goals Patient Stated Goal: to get stronger PT Goal Formulation: With patient/family Time For Goal Achievement: 06/18/20 Potential to Achieve Goals: Good    Frequency Min 2X/week   Barriers to discharge Decreased caregiver support;Inaccessible home environment      Co-evaluation               AM-PAC PT "6 Clicks" Mobility  Outcome Measure Help needed turning from your back to your side while in a flat bed without using bedrails?: A Lot Help needed moving from lying on your back to sitting on the side of a flat bed without using bedrails?: A Lot Help needed moving to and from a bed to a chair (including a wheelchair)?: A Lot Help needed standing up from a chair using your arms (e.g., wheelchair or bedside chair)?: A Lot Help needed to walk in hospital room?: Total Help needed climbing 3-5 steps with a railing? : Total 6 Click Score: 10    End of Session Equipment Utilized During Treatment: Gait belt Activity Tolerance: Patient limited by fatigue Patient left: in chair;with chair alarm set;with call bell/phone within reach Nurse Communication: Mobility status PT Visit  Diagnosis: Other abnormalities of gait and mobility (R26.89);Difficulty in walking, not elsewhere classified (R26.2);Muscle weakness (generalized) (M62.81)    Time: 2297-9892 PT Time Calculation (min) (ACUTE ONLY): 38 min   Charges:   PT Evaluation $PT Eval Low Complexity: 1 Low PT Treatments $Therapeutic Activity: 23-37 mins       Olga Coaster PT, DPT 11:56 AM,06/04/20

## 2020-06-04 NOTE — Progress Notes (Signed)
PHARMACIST - PHYSICIAN COMMUNICATION  CONCERNING:  Enoxaparin (Lovenox) for DVT Prophylaxis    RECOMMENDATION: Patient was prescribed enoxaprin 40mg  q24 hours for VTE prophylaxis.   Filed Weights   06/03/20 1959  Weight: 70 kg (154 lb 5.2 oz)    Body mass index is 29.16 kg/m.  Estimated Creatinine Clearance: 23 mL/min (A) (by C-G formula based on SCr of 1.54 mg/dL (H)).  Patient is candidate for enoxaparin 30mg  every 24 hours based on CrCl <59ml/min or Weight <45kg  DESCRIPTION: Pharmacy has adjusted enoxaparin dose per [facility] policy.  Patient is now receiving enoxaparin 30 mg every 24 hours   31m, PharmD, [facility] 06/04/2020 12:43 AM

## 2020-06-04 NOTE — ED Provider Notes (Signed)
-----------------------------------------   12:19 AM on 06/04/2020 -----------------------------------------  Patient unable to ambulate even with assistance due to weakness.  Given the patient's significant urinary tract infection and weakness we will admit to the hospital service.  Patient receiving IV Rocephin, IV fluids.   Minna Antis, MD 06/04/20 [phone]

## 2020-06-04 NOTE — Progress Notes (Signed)
PROGRESS NOTE  Ebony Torres  DOB: 1930/01/30  PCP: Marguarite Arbour, MD HQP:[id number]  DOA: 06/03/2020  LOS: 0 days  Hospital Day: 2   Chief Complaint  Patient presents with  . Weakness   Brief narrative: Ebony Torres is a 87 y.o. female with PMH significant for DM2, HTN, HLD, A. fib, GERD, glaucoma Patient presents to the ED on 5/20 with acute onset generalized weakness , unable to ambulate, suprapubic pain with no other urinary symptoms In the ED, patient was afebrile, heart rate 101, blood pressure 161/78 Labs with WBC count of 11.9, creatinine 1.54 Urinalysis with cloudy amber urine, large amount leukocytes and many bacteria Patient was given IV Rocephin, IV fluid and admitted to hospitalist service  Subjective: Patient was seen and examined this morning.  Pleasant elderly African-American female.  Propped up in bed.  Not in distress.  Feels weak. Chart reviewed. Blood pressure overnight and 150s. Labs this morning with creatinine better at 1.23, WBC count improved to 10.4  Assessment/Plan: Symptomatic UTI -Presented with generalized weakness, suprapubic pain -Urinalysis with cloudy amber urine, large amount leukocytes and many bacteria -Currently on IV Rocephin.  Urine culture pending. -WBC count improving Recent Labs  Lab 06/03/20 2004 06/04/20 0611  WBC 11.9* 10.4   AKI -Unclear baseline creatinine.  Presented with a creatinine elevated at 1.54, improving with hydration.  Continue to hydrate Recent Labs    06/03/20 2004 06/04/20 0611  BUN 30* 24*  CREATININE 1.54* 1.23*   Hypokalemia -Improved with replacement. Continue to monitor. Recent Labs  Lab 06/03/20 2004 06/04/20 0611  K 3.4* 3.9  MG  --  1.8   Generalized weakness -Probably related to UTI and dehydration -PT eval  GERD -Heartburn and abdominal pain -Takes omeprazole at home.  Continue PPI  Microcytic anemia -Unclear baseline hemoglobin.  Currently hemoglobin between 9-10.  No  active bleeding. Recent Labs    06/03/20 2004 06/04/20 0611  HGB 9.6* 9.0*  MCV 79.5* 80.8   Essential hypertension. -Home meds include Toprol, losartan and HCTZ -Continue Toprol.  Continue to hold losartan and HCTZ -Hydralazine as needed  Type 2 diabetes mellitus -Pending A1c  -Seems diet controlled No results found for: HGBA1C Recent Labs  Lab 06/04/20 0938  GLUCAP 103*   Glaucoma. -Continue eyedrops   Dyslipidemia. -Patient does not seem to be taking Lipitor 10  Mobility: Pending PT eval Code Status:   Code Status: DNR  Nutritional status: Body mass index is 19.52 kg/m.     Diet Order            Diet Carb Modified Fluid consistency: Thin; Room service appropriate? Yes  Diet effective now                 DVT prophylaxis: enoxaparin (LOVENOX) injection 30 mg Start: 06/04/20 0800   Antimicrobials:  IV Rocephin Fluid: Normal saline 100 mL/h Consultants: None Family Communication:  None at bedside  Status is: Inpatient  Remains inpatient appropriate because: On IV antibiotics, pending urine culture report  Dispo: The patient is from: Home              Anticipated d/c is to: Hopefully home tomorrow              Patient currently is not medically stable to d/c.   Difficult to place patient No     Infusions:  . sodium chloride 100 mL/hr at 06/04/20 0112  . cefTRIAXone (ROCEPHIN)  IV      Scheduled Meds: .  enoxaparin (LOVENOX) injection  30 mg Subcutaneous Q24H  . insulin aspart  0-9 Units Subcutaneous TID AC & HS  . mouth rinse  15 mL Mouth Rinse BID  . multivitamin with minerals   Oral Daily  . Travoprost (BAK Free)  1 drop Both Eyes QHS    Antimicrobials: Anti-infectives (From admission, onward)   Start     Dose/Rate Route Frequency Ordered Stop   06/04/20 2300  cefTRIAXone (ROCEPHIN) 1 g in sodium chloride 0.9 % 100 mL IVPB        1 g 200 mL/hr over 30 Minutes Intravenous Every 24 hours 06/04/20 0036     06/03/20 2245  cefTRIAXone  (ROCEPHIN) 1 g in sodium chloride 0.9 % 100 mL IVPB        1 g 200 mL/hr over 30 Minutes Intravenous  Once 06/03/20 2237 06/03/20 2330   06/03/20 0000  cephALEXin (KEFLEX) 500 MG capsule        500 mg Oral 4 times daily 06/03/20 2335 06/13/20 2359      PRN meds: acetaminophen **OR** acetaminophen, magnesium hydroxide, ondansetron **OR** ondansetron (ZOFRAN) IV, traZODone   Objective: Vitals:   06/04/20 0607 06/04/20 0937  BP: (!) 156/69 (!) 150/76  Pulse: 85 85  Resp: 18 20  Temp: 98.2 F (36.8 C) 99.1 F (37.3 C)  SpO2: 100% 100%   No intake or output data in the 24 hours ending 06/04/20 1013 Filed Weights   06/03/20 1959 06/04/20 0117  Weight: 70 kg 48.4 kg   Weight change:  Body mass index is 19.52 kg/m.   Physical Exam: General exam: Pleasant elderly African-American female.  Not in physical distress Skin: No rashes, lesions or ulcers. HEENT: Atraumatic, normocephalic, no obvious bleeding Lungs: Clear to auscultation bilaterally CVS: Regular rate and rhythm, no murmur GI/Abd soft, nontender, nondistended, bowel sound present CNS: Alert, awake, oriented x3 Psychiatry: Mood appropriate Extremities: No pedal edema, no calf tenderness  Data Review: I have personally reviewed the laboratory data and studies available.  Recent Labs  Lab 06/03/20 2004 06/04/20 0611  WBC 11.9* 10.4  HGB 9.6* 9.0*  HCT 27.6* 26.5*  MCV 79.5* 80.8  PLT 280 246   Recent Labs  Lab 06/03/20 2004 06/04/20 0611  NA 141 141  K 3.4* 3.9  CL 107 110  CO2 25 24  GLUCOSE 107* 107*  BUN 30* 24*  CREATININE 1.54* 1.23*  CALCIUM 9.1 8.6*  MG  --  1.8    F/u labs ordered Unresulted Labs (From admission, onward)          Start     Ordered   06/04/20 0252  Hemoglobin A1c  Once,   R       Comments: To assess prior glycemic control    06/04/20 0251   06/03/20 2237  Urine culture  Add-on,   AD        06/03/20 2237          Signed, Lorin Glass, MD [facility] 06/04/2020

## 2020-06-04 NOTE — ED Notes (Addendum)
No pharmacy tech, son reviewed meds with Clinical research associate as follows:  mvi am, asa 81mg  am, losartan/hctz 100/25 am, omeprazole 40mg  prn, metoprolol 50mg  am, travoprost 1 drop both eyes hs  Pt reports took am meds at noon

## 2020-06-04 NOTE — ED Notes (Addendum)
Stool incontinence, pericare provided, new purewick placed. Attempted to ambulate, pt weak and can barely get out of the bed to sit on the side of the bed, pt was able to stand with walker but legs shaking d/t weakness. Provider aware and will admit pt, son updated.

## 2020-06-04 NOTE — Progress Notes (Signed)
Initial Nutrition Assessment  DOCUMENTATION CODES:   Not applicable  INTERVENTION:   -MVI with minerals daily -Ensure Enlive po BID, each supplement provides 350 kcal and 20 grams of protein  NUTRITION DIAGNOSIS:   Inadequate oral intake related to decreased appetite as evidenced by meal completion < 50%.  GOAL:   Patient will meet greater than or equal to 90% of their needs  MONITOR:   PO intake,Supplement acceptance,Labs,Weight trends,Skin,I & O's  REASON FOR ASSESSMENT:   Malnutrition Screening Tool    ASSESSMENT:   Ebony Torres is a 87 y.o. African-American female with medical history significant for type 2 diabetes mellitus, hypertension, dyslipidemia, atrial fibrillation, GERD and glaucoma, who presented to the emergency room with acute onset of generalized weakness that started today to the point that she was not able to ambulate.  She has been having occasional cough without wheezing or dyspnea.  She admitted to lower abdominal suprapubic pain without nausea vomiting or diarrhea. No significant urinary symptoms including urinary frequency urge  No headache or dizziness or blurred vision.ncy or dysuria or oliguria or flank pain.  No chest pain or palpitations.  Pt admitted with generalized weakness with inability to ambulate secondary to UTI and dehydration.   Reviewed I/O's: +480 ml x 24 hours  Pt unavailable at time of visit. Attempted to speak with pt via call to hospital room phone, however, unable to reach. RD unable to obtain further nutrition-related history or complete nutrition-focused physical exam at this time.   Pt with poor oral intake. Noted meal completions 0-50%.   Reviewed wt hx; pt with distant history of weight loss.   Pt is at risk for malnutrition, however, unable to identify at this time. Pt would greatly benefit from addition of oral nutrition supplements.   Pt with poor oral intake and would benefit from nutrient dense supplement. One  Ensure Enlive supplement provides 350 kcals, 20 grams protein, and 44-45 grams of carbohydrate vs one Glucerna shake supplement, which provides 220 kcals, 10 grams of protein, and 26 grams of carbohydrate. Given pt's hx of DM, RD will reassess adequacy of PO intake, CBGS, and adjust supplement regimen as appropriate at follow-up.   Medications reviewed and include 0.9% sodium chloride infusion @ 100 ml/hr.   No results found for: HGBA1C PTA DM medications are 1000 mg metformin daily.  Labs reviewed: CBGS: 86-103 (inpatient orders for glycemic control are 0-9 units insulin aspart TID before meals and at bedtime).   Diet Order:   Diet Order            Diet Carb Modified Fluid consistency: Thin; Room service appropriate? Yes  Diet effective now                 EDUCATION NEEDS:   No education needs have been identified at this time  Skin:  Skin Assessment: Reviewed RN Assessment  Last BM:  Unknown  Height:   Ht Readings from Last 1 Encounters:  06/04/20 5\' 2"  (1.575 m)    Weight:   Wt Readings from Last 1 Encounters:  06/04/20 48.4 kg    Ideal Body Weight:  50 kg  BMI:  Body mass index is 19.52 kg/m.  Estimated Nutritional Needs:   Kcal:  1450-1650  Protein:  65-80 grams  Fluid:  > 1.4 L    06/06/20, RD, LDN, CDCES Registered Dietitian II Certified Diabetes Care and Education Specialist Please refer to [facility] for RD and/or RD on-call/weekend/after hours pager

## 2020-06-05 LAB — GASTROINTESTINAL PANEL BY PCR, STOOL (REPLACES STOOL CULTURE)

## 2020-06-05 LAB — BASIC METABOLIC PANEL
Anion gap: 6 (ref 5–15)
BUN: 20 mg/dL (ref 8–23)
CO2: 23 mmol/L (ref 22–32)
Calcium: 8.7 mg/dL — ABNORMAL LOW (ref 8.9–10.3)
Chloride: 114 mmol/L — ABNORMAL HIGH (ref 98–111)
Creatinine, Ser: 1.13 mg/dL — ABNORMAL HIGH (ref 0.44–1.00)
GFR, Estimated: 47 mL/min — ABNORMAL LOW (ref >60)
Glucose, Bld: 101 mg/dL — ABNORMAL HIGH (ref 70–99)
Potassium: 3.5 mmol/L (ref 3.5–5.1)
Sodium: 143 mmol/L (ref 135–145)

## 2020-06-05 LAB — CBC WITH DIFFERENTIAL/PLATELET
Abs Immature Granulocytes: 0.04 10*3/uL (ref 0.00–0.07)
Basophils Absolute: 0 10*3/uL (ref 0.0–0.1)
Basophils Relative: 0 %
Eosinophils Absolute: 0.3 10*3/uL (ref 0.0–0.5)
Eosinophils Relative: 3 %
HCT: 25.2 % — ABNORMAL LOW (ref 36.0–46.0)
Hemoglobin: 8.4 g/dL — ABNORMAL LOW (ref 12.0–15.0)
Immature Granulocytes: 0 %
Lymphocytes Relative: 15 %
Lymphs Abs: 1.4 10*3/uL (ref 0.7–4.0)
MCH: 27.5 pg (ref 26.0–34.0)
MCHC: 33.3 g/dL (ref 30.0–36.0)
MCV: 82.4 fL (ref 80.0–100.0)
Monocytes Absolute: 0.8 10*3/uL (ref 0.1–1.0)
Monocytes Relative: 9 %
Neutro Abs: 6.8 10*3/uL (ref 1.7–7.7)
Neutrophils Relative %: 73 %
Platelets: 233 10*3/uL (ref 150–400)
RBC: 3.06 MIL/uL — ABNORMAL LOW (ref 3.87–5.11)
RDW: 15.1 % (ref 11.5–15.5)
WBC: 9.3 10*3/uL (ref 4.0–10.5)
nRBC: 0 % (ref 0.0–0.2)

## 2020-06-05 LAB — URINE CULTURE

## 2020-06-05 LAB — GLUCOSE, CAPILLARY
Glucose-Capillary: 109 mg/dL — ABNORMAL HIGH (ref 70–99)
Glucose-Capillary: 126 mg/dL — ABNORMAL HIGH (ref 70–99)
Glucose-Capillary: 155 mg/dL — ABNORMAL HIGH (ref 70–99)
Glucose-Capillary: 133 mg/dL — ABNORMAL HIGH (ref 70–99)

## 2020-06-05 MED ORDER — LOPERAMIDE HCL 2 MG PO CAPS
2.0000 mg | ORAL_CAPSULE | Freq: Three times a day (TID) | ORAL | Status: DC | PRN
  Administered 2020-06-06: 2 mg via ORAL
  Filled 2020-06-05: qty 1

## 2020-06-05 NOTE — Progress Notes (Signed)
PROGRESS NOTE  Ebony Torres  DOB: July 22, 1930  PCP: Marguarite Arbour, MD ASN:[id number]  DOA: 06/03/2020  LOS: 1 day  Hospital Day: 3   Chief Complaint  Patient presents with  . Weakness   Brief narrative: Ebony Torres is a 87 y.o. female with PMH significant for DM2, HTN, HLD, A. fib, GERD, glaucoma Patient presents to the ED on 5/20 with acute onset generalized weakness , unable to ambulate, suprapubic pain with no other urinary symptoms In the ED, patient was afebrile, heart rate 101, blood pressure 161/78 Labs with WBC count of 11.9, creatinine 1.54 Urinalysis with cloudy amber urine, large amount leukocytes and many bacteria Patient was given IV Rocephin, IV fluid and admitted to hospitalist service. See below for details  Subjective: Patient was seen and examined this morning.  Lying down in bed.  Feels weak. Patient states that for the last several weeks, she has frequent diarrhea which has been making her weak and dehydrated. Patient had a temperature of 100.4 last night.  Assessment/Plan: Symptomatic UTI -Presented with generalized weakness, suprapubic pain -Urinalysis with cloudy amber urine, large amount leukocytes and many bacteria -Currently on IV Rocephin.  Urine culture is growing multiple species. -WBC count improving.  Had a temperature of 100.4 last night. Continue to monitor on IV Rocephin. Recent Labs  Lab 06/03/20 2004 06/04/20 0611 06/05/20 0613  WBC 11.9* 10.4 9.3   Persistent diarrhea for weeks -Continues to have diarrhea in the hospital. -Continue IV fluid.  Obtain GI pathogen panel.  AKI -Unclear baseline creatinine. Presented with a creatinine elevated at 1.54, improving with hydration.  Continue to hydrate Recent Labs    06/03/20 2004 06/04/20 0611 06/05/20 0613  BUN 30* 24* 20  CREATININE 1.54* 1.23* 1.13*   Hypokalemia -Improved with replacement. Continue to monitor. Recent Labs  Lab 06/03/20 2004 06/04/20 1937  06/05/20 0613  K 3.4* 3.9 3.5  MG  --  1.8  --    Generalized weakness -Probably related to UTI and dehydration -PT eval obtained.  SNF recommended.  GERD -Heartburn and abdominal pain -Takes omeprazole at home. Continue PPI  Microcytic anemia -Unclear baseline hemoglobin. Currently hemoglobin between 9-10. No active bleeding but hemoglobin seems to be gradually dropping. Suspect dilutional effect. Recent Labs    06/03/20 2004 06/04/20 0611 06/05/20 0613  HGB 9.6* 9.0* 8.4*  MCV 79.5* 80.8 82.4   Essential hypertension. -Home meds include Toprol, losartan and HCTZ -Continue Toprol.  Continue to hold losartan and HCTZ -Hydralazine as needed  Type 2 diabetes mellitus -A1c 6.1 on 5/21. -Seems diet controlled Recent Labs  Lab 06/04/20 0938 06/04/20 1220 06/04/20 1752 06/04/20 2150 06/05/20 0754  GLUCAP 103* 86 90 149* 109*   Glaucoma. -Continue eyedrops   Dyslipidemia. -Patient does not seem to be taking Lipitor 10  Mobility: PT eval recommended SNF Code Status:   Code Status: DNR  Nutritional status: Body mass index is 19.52 kg/m. Nutrition Problem: Inadequate oral intake Etiology: decreased appetite Signs/Symptoms: meal completion < 50% Diet Order            Diet Carb Modified Fluid consistency: Thin; Room service appropriate? Yes  Diet effective now                 DVT prophylaxis: enoxaparin (LOVENOX) injection 30 mg Start: 06/04/20 0800   Antimicrobials:  IV Rocephin Fluid: Normal saline 100 mL/h Consultants: None Family Communication:  Son at bedside  Status is: Inpatient  Remains inpatient appropriate because: On IV antibiotics, pending  urine culture report  Dispo: The patient is from: Home              Anticipated d/c is to: SNF recommended.              Patient currently is not medically stable to d/c.   Difficult to place patient No   Infusions:  . sodium chloride 100 mL/hr at 06/05/20 0820  . cefTRIAXone (ROCEPHIN)  IV Stopped  (06/04/20 2234)    Scheduled Meds: . acidophilus  2 capsule Oral Daily  . enoxaparin (LOVENOX) injection  30 mg Subcutaneous Q24H  . feeding supplement  237 mL Oral BID BM  . insulin aspart  0-9 Units Subcutaneous TID AC & HS  . mouth rinse  15 mL Mouth Rinse BID  . multivitamin with minerals   Oral Daily  . Travoprost (BAK Free)  1 drop Both Eyes QHS    Antimicrobials: Anti-infectives (From admission, onward)   Start     Dose/Rate Route Frequency Ordered Stop   06/04/20 2300  cefTRIAXone (ROCEPHIN) 1 g in sodium chloride 0.9 % 100 mL IVPB        1 g 200 mL/hr over 30 Minutes Intravenous Every 24 hours 06/04/20 0036     06/03/20 2245  cefTRIAXone (ROCEPHIN) 1 g in sodium chloride 0.9 % 100 mL IVPB        1 g 200 mL/hr over 30 Minutes Intravenous  Once 06/03/20 2237 06/03/20 2330   06/03/20 0000  cephALEXin (KEFLEX) 500 MG capsule        500 mg Oral 4 times daily 06/03/20 2335 06/13/20 2359      PRN meds: acetaminophen **OR** acetaminophen, magnesium hydroxide, ondansetron **OR** ondansetron (ZOFRAN) IV, traZODone   Objective: Vitals:   06/05/20 0652 06/05/20 0753  BP: (!) 147/69 126/60  Pulse: 90 93  Resp: 16 18  Temp: 98.3 F (36.8 C) 98.3 F (36.8 C)  SpO2: 100% 97%    Intake/Output Summary (Last 24 hours) at 06/05/2020 1048 Last data filed at 06/05/2020 1019 Gross per 24 hour  Intake 3662.96 ml  Output 1577 ml  Net 2085.96 ml   Filed Weights   06/03/20 1959 06/04/20 0117  Weight: 70 kg 48.4 kg   Weight change:  Body mass index is 19.52 kg/m.   Physical Exam: General exam: Pleasant elderly African-American female.  Not in physical distress.  Seems weak Skin: No rashes, lesions or ulcers. HEENT: Atraumatic, normocephalic, no obvious bleeding Lungs: Clear to auscultation bilaterally CVS: Regular rate and rhythm, no murmur GI/Abd soft, nontender, nondistended, bowel sound present CNS: Alert, awake, oriented x3 Psychiatry: Mood appropriate Extremities: No  pedal edema, no calf tenderness  Data Review: I have personally reviewed the laboratory data and studies available.  Recent Labs  Lab 06/03/20 2004 06/04/20 0611 06/05/20 0613  WBC 11.9* 10.4 9.3  NEUTROABS  --   --  6.8  HGB 9.6* 9.0* 8.4*  HCT 27.6* 26.5* 25.2*  MCV 79.5* 80.8 82.4  PLT 280 246 233   Recent Labs  Lab 06/03/20 2004 06/04/20 0611 06/05/20 0613  NA 141 141 143  K 3.4* 3.9 3.5  CL 107 110 114*  CO2 25 24 23   GLUCOSE 107* 107* 101*  BUN 30* 24* 20  CREATININE 1.54* 1.23* 1.13*  CALCIUM 9.1 8.6* 8.7*  MG  --  1.8  --     F/u labs ordered Unresulted Labs (From admission, onward)         None  Signed, Lorin Glass, MD [facility] 06/05/2020

## 2020-06-05 NOTE — Plan of Care (Signed)
Pt voided today but not much.  At shift change bladder scan showed about .  Night RN made aware and will contact MD for In&out cath.

## 2020-06-05 NOTE — NC FL2 (Signed)
[place] MEDICAID FL2 LEVEL OF CARE SCREENING TOOL     IDENTIFICATION  Patient Name: RAYEL SANTIZO Birthdate: 01/06/1931 Sex: female Admission Date (Current Location): 06/03/2020  [place] and [place] Number:  Chiropodist and Address:  [facility], [address] [place], [place] [address]      Provider Number: [id number]  Attending Physician Name and Address:  Lorin Glass, MD  Relative Name and Phone Number:  Artia Singley [phone]    Current Level of Care: Hospital Recommended Level of Care: Skilled Nursing Facility Prior Approval Number:    Date Approved/Denied: 06/05/20 PASRR Number: [id number]  Discharge Plan: SNF    Current Diagnoses: Patient Active Problem List   Diagnosis Date Noted  . Unable to walk 06/04/2020    Orientation RESPIRATION BLADDER Height & Weight     Self,Time,Situation,Place  Normal Incontinent Weight: 48.4 kg Height:  5\' 2"  (157.5 cm)  BEHAVIORAL SYMPTOMS/MOOD NEUROLOGICAL BOWEL NUTRITION STATUS      Incontinent Diet  AMBULATORY STATUS COMMUNICATION OF NEEDS Skin   Limited Assist Verbally Normal                       Personal Care Assistance Level of Assistance  Bathing,Dressing,Feeding Bathing Assistance: Limited assistance Feeding assistance: Limited assistance Dressing Assistance: Limited assistance     Functional Limitations Info             SPECIAL CARE FACTORS FREQUENCY  PT (By licensed PT),OT (By licensed OT)     PT Frequency: 5 X a week OT Frequency: 5 X a week            Contractures      Additional Factors Info                  Current Medications (06/05/2020):  This is the current hospital active medication list Current Facility-Administered Medications  Medication Dose Route Frequency Provider Last Rate Last Admin  . 0.9 %  sodium chloride infusion   Intravenous Continuous Mansy, Jan A, MD 100 mL/hr at 06/05/20 1626 Infusion Verify at 06/05/20 1626   . acetaminophen (TYLENOL) tablet 650 mg  650 mg Oral Q6H PRN Mansy, Jan A, MD       Or  . acetaminophen (TYLENOL) suppository 650 mg  650 mg Rectal Q6H PRN Mansy, Jan A, MD      . acidophilus (RISAQUAD) capsule 2 capsule  2 capsule Oral Daily Feb, NP   2 capsule at 06/05/20 0819  . cefTRIAXone (ROCEPHIN) 1 g in sodium chloride 0.9 % 100 mL IVPB  1 g Intravenous Q24H Mansy, 06/07/20, MD   Stopped at 06/04/20 2234  . enoxaparin (LOVENOX) injection 30 mg  30 mg Subcutaneous Q24H Mansy, Jan A, MD   30 mg at 06/05/20 06/07/20  . feeding supplement (ENSURE ENLIVE / ENSURE PLUS) liquid 237 mL  237 mL Oral BID BM Dahal, Binaya, MD   237 mL at 06/05/20 1438  . insulin aspart (novoLOG) injection 0-9 Units  0-9 Units Subcutaneous TID [facility] Mansy, [facility], MD   1 Units at 06/04/20 2156  . loperamide (IMODIUM) capsule 2 mg  2 mg Oral Q8H PRN Dahal, Binaya, MD      . magnesium hydroxide (MILK OF MAGNESIA) suspension 30 mL  30 mL Oral Daily PRN Mansy, Jan A, MD      . MEDLINE mouth rinse  15 mL Mouth Rinse BID Mansy, Feb, MD   15 mL  at 06/05/20 0819  . multivitamin with minerals tablet   Oral Daily Mansy, Vernetta Honey, MD   1 tablet at 06/05/20 [phone]  . ondansetron (ZOFRAN) tablet 4 mg  4 mg Oral Q6H PRN Mansy, Jan A, MD       Or  . ondansetron [facility]) injection 4 mg  4 mg Intravenous Q6H PRN Mansy, Jan A, MD      . Travoprost (BAK Free) (TRAVATAN) 0.004 % ophthalmic solution SOLN 1 drop  1 drop Both Eyes QHS Mansy, Jan A, MD   1 drop at 06/04/20 2157  . traZODone (DESYREL) tablet 25 mg  25 mg Oral QHS PRN Mansy, Vernetta Honey, MD         Discharge Medications: Please see discharge summary for a list of discharge medications.  Relevant Imaging Results:  Relevant Lab Results:   Additional Information    Ashley Royalty, Lutricia Feil, RN

## 2020-06-06 LAB — GLUCOSE, CAPILLARY
Glucose-Capillary: 102 mg/dL — ABNORMAL HIGH (ref 70–99)
Glucose-Capillary: 143 mg/dL — ABNORMAL HIGH (ref 70–99)
Glucose-Capillary: 122 mg/dL — ABNORMAL HIGH (ref 70–99)

## 2020-06-06 MED ORDER — ENSURE ENLIVE PO LIQD: 237.0000 mL | Freq: Two times a day (BID) | ORAL | 12 refills | Status: AC

## 2020-06-06 MED ORDER — RISAQUAD PO CAPS: 2.0000 | ORAL_CAPSULE | Freq: Every day | ORAL | 0 refills | Status: AC

## 2020-06-06 MED ORDER — LOPERAMIDE HCL 2 MG PO CAPS: 2.0000 mg | ORAL_CAPSULE | Freq: Two times a day (BID) | ORAL | 0 refills | Status: AC | PRN

## 2020-06-06 MED ORDER — CEPHALEXIN 500 MG PO CAPS: 500.0000 mg | ORAL_CAPSULE | Freq: Three times a day (TID) | ORAL | 0 refills | Status: AC

## 2020-06-06 MED ORDER — LOSARTAN POTASSIUM 50 MG PO TABS: 50.0000 mg | ORAL_TABLET | Freq: Every day | ORAL | Status: AC

## 2020-06-06 MED ORDER — LOPERAMIDE HCL 2 MG PO CAPS
2.0000 mg | ORAL_CAPSULE | Freq: Two times a day (BID) | ORAL | Status: DC
  Administered 2020-06-06: 2 mg via ORAL
  Filled 2020-06-06: qty 1

## 2020-06-06 NOTE — Progress Notes (Signed)
PROGRESS NOTE  Ebony Torres  DOB: 12-12-1930  PCP: Marguarite Arbour, MD GXQ:[id number]  DOA: 06/03/2020  LOS: 2 days  Hospital Day: 4   Chief Complaint  Patient presents with  . Weakness   Brief narrative: Ebony Torres is a 87 y.o. female with PMH significant for DM2, HTN, HLD, A. fib, GERD, glaucoma Patient presents to the ED on 5/20 with acute onset generalized weakness , unable to ambulate, suprapubic pain with no other urinary symptoms In the ED, patient was afebrile, heart rate 101, blood pressure 161/78 Labs with WBC count of 11.9, creatinine 1.54 Urinalysis with cloudy amber urine, large amount leukocytes and many bacteria Patient was given IV Rocephin, IV fluid and admitted to hospitalist service. See below for details  Subjective: Patient was seen and examined this morning.  Lying down in bed.  Feels weak. Propped up in bed.  Not in distress.  Diarrhea seems to have slowed down.  More soft stool last night. No fever.  T-max of 100 GI pathogen panel negative.  Assessment/Plan: Symptomatic UTI -Presented with generalized weakness, suprapubic pain -Urinalysis with cloudy amber urine, large amount leukocytes and many bacteria -Currently improving on IV Rocephin.  Urine culture is growing multiple species. -WBC count improving.  Had a temperature of 100 last night. Continue to monitor on IV Rocephin. Recent Labs  Lab 06/03/20 2004 06/04/20 0611 06/05/20 0613  WBC 11.9* 10.4 9.3   Persistent diarrhea for weeks -GI pathogen panel negative.  Patient states stool is more soft now.  Continue to monitor.  AKI -Unclear baseline creatinine. Presented with a creatinine elevated at 1.54, improving with hydration.  -Continue to hydrate.  Reduce IV fluid to 50 mill per hour. Recent Labs    06/03/20 2004 06/04/20 0611 06/05/20 0613  BUN 30* 24* 20  CREATININE 1.54* 1.23* 1.13*   Hypokalemia -Improved with replacement. Continue to monitor. Recent Labs  Lab  06/03/20 2004 06/04/20 1448 06/05/20 0613  K 3.4* 3.9 3.5  MG  --  1.8  --    Generalized weakness -Probably related to UTI and dehydration -PT eval obtained.  SNF recommended.  GERD -Heartburn and abdominal pain -Takes omeprazole at home. Continue PPI.  Microcytic anemia -Unclear baseline hemoglobin. Currently hemoglobin between 9-10. No active bleeding but hemoglobin seems to be gradually dropping. Suspect dilutional effect. Recent Labs    06/03/20 2004 06/04/20 0611 06/05/20 0613  HGB 9.6* 9.0* 8.4*  MCV 79.5* 80.8 82.4   Essential hypertension. -Home meds include Toprol, losartan and HCTZ -Continue Toprol.  Continue to hold losartan and HCTZ -Hydralazine as needed  Type 2 diabetes mellitus -A1c 6.1 on 5/21. -Seems diet controlled Recent Labs  Lab 06/05/20 0754 06/05/20 1204 06/05/20 1551 06/05/20 2041 06/06/20 0750  GLUCAP 109* 126* 155* 133* 102*   Glaucoma. -Continue eyedrops   Dyslipidemia. -Patient does not seem to be taking Lipitor 10  Mobility: PT eval recommended SNF Code Status:   Code Status: DNR  Nutritional status: Body mass index is 19.52 kg/m. Nutrition Problem: Inadequate oral intake Etiology: decreased appetite Signs/Symptoms: meal completion < 50% Diet Order            Diet Carb Modified Fluid consistency: Thin; Room service appropriate? Yes  Diet effective now                 DVT prophylaxis: enoxaparin (LOVENOX) injection 30 mg Start: 06/04/20 0800   Antimicrobials:  IV Rocephin Fluid: Normal saline 50 mL/h Consultants: None Family Communication:  Son not  at bedside today  Status is: Inpatient  Remains inpatient appropriate because: Pending SNF placement  Dispo: The patient is from: Home              Anticipated d/c is to: SNF recommended.              Patient currently is medically stable to d/c.   Difficult to place patient No   Infusions:  . sodium chloride 100 mL/hr at 06/06/20 0556  . cefTRIAXone  (ROCEPHIN)  IV 1 g (06/05/20 2225)    Scheduled Meds: . acidophilus  2 capsule Oral Daily  . enoxaparin (LOVENOX) injection  30 mg Subcutaneous Q24H  . feeding supplement  237 mL Oral BID BM  . insulin aspart  0-9 Units Subcutaneous TID AC & HS  . loperamide  2 mg Oral BID  . mouth rinse  15 mL Mouth Rinse BID  . multivitamin with minerals   Oral Daily  . Travoprost (BAK Free)  1 drop Both Eyes QHS    Antimicrobials: Anti-infectives (From admission, onward)   Start     Dose/Rate Route Frequency Ordered Stop   06/04/20 2300  cefTRIAXone (ROCEPHIN) 1 g in sodium chloride 0.9 % 100 mL IVPB        1 g 200 mL/hr over 30 Minutes Intravenous Every 24 hours 06/04/20 0036     06/03/20 2245  cefTRIAXone (ROCEPHIN) 1 g in sodium chloride 0.9 % 100 mL IVPB        1 g 200 mL/hr over 30 Minutes Intravenous  Once 06/03/20 2237 06/03/20 2330   06/03/20 0000  cephALEXin (KEFLEX) 500 MG capsule        500 mg Oral 4 times daily 06/03/20 2335 06/13/20 2359      PRN meds: acetaminophen **OR** acetaminophen, magnesium hydroxide, ondansetron **OR** ondansetron (ZOFRAN) IV, traZODone   Objective: Vitals:   06/06/20 0538 06/06/20 0749  BP: 118/64 116/60  Pulse: (!) 102 93  Resp: 14 18  Temp: 98.5 F (36.9 C) 98.4 F (36.9 C)  SpO2: 100% 100%    Intake/Output Summary (Last 24 hours) at 06/06/2020 1057 Last data filed at 06/06/2020 0556 Gross per 24 hour  Intake 6129.75 ml  Output 925 ml  Net 5204.75 ml   Filed Weights   06/03/20 1959 06/04/20 0117  Weight: 70 kg 48.4 kg   Weight change:  Body mass index is 19.52 kg/m.   Physical Exam: General exam: Pleasant elderly African-American female. Not in physical distress.  Seems weak. Skin: No rashes, lesions or ulcers. HEENT: Atraumatic, normocephalic, no obvious bleeding Lungs: Clear to auscultation bilaterally CVS: Regular rate and rhythm, no murmur GI/Abd soft, nontender, nondistended, bowel sound present CNS: Alert, awake, oriented  x3 Psychiatry: Mood appropriate Extremities: No pedal edema, no calf tenderness  Data Review: I have personally reviewed the laboratory data and studies available.  Recent Labs  Lab 06/03/20 2004 06/04/20 0611 06/05/20 0613  WBC 11.9* 10.4 9.3  NEUTROABS  --   --  6.8  HGB 9.6* 9.0* 8.4*  HCT 27.6* 26.5* 25.2*  MCV 79.5* 80.8 82.4  PLT 280 246 233   Recent Labs  Lab 06/03/20 2004 06/04/20 0611 06/05/20 0613  NA 141 141 143  K 3.4* 3.9 3.5  CL 107 110 114*  CO2 25 24 23   GLUCOSE 107* 107* 101*  BUN 30* 24* 20  CREATININE 1.54* 1.23* 1.13*  CALCIUM 9.1 8.6* 8.7*  MG  --  1.8  --     F/u labs  ordered Unresulted Labs (From admission, onward)          Start     Ordered   06/07/20 0500  Basic metabolic panel  Daily,   R     Question:  Specimen collection method  Answer:  Lab=Lab collect   06/06/20 0807   06/07/20 0500  CBC with Differential/Platelet  Daily,   R     Question:  Specimen collection method  Answer:  Lab=Lab collect   06/06/20 0807   06/07/20 0500  Magnesium  Tomorrow morning,   R       Question:  Specimen collection method  Answer:  Lab=Lab collect   06/06/20 0807   06/07/20 0500  Phosphorus  Tomorrow morning,   R       Question:  Specimen collection method  Answer:  Lab=Lab collect   06/06/20 7591          Signed, Lorin Glass, MD [facility] 06/06/2020

## 2020-06-06 NOTE — TOC Transition Note (Signed)
Transition of Care [facility]) - CM/SW Discharge Note   Patient Details  Name: Ebony Torres MRN: [id number] Date of Birth: Feb 12, 1930  Transition of Care [facility]) CM/SW Contact:  Margarito Liner, LCSW Phone Number: 06/06/2020, 4:23 PM   Clinical Narrative: Patient has orders to discharge to [facility] today. TOC coworker called in room and notified family that transport would be set up shortly. They said patient is fully vaccinated with booster Proofreader). RN will call report to [phone] (Room 25A). No further concerns. CSW signing off.    Final next level of care: Skilled Nursing Facility Barriers to Discharge: Barriers Resolved   Patient Goals and CMS Choice     Choice offered to / list presented to : Adult Children  Discharge Placement              Patient chooses bed at: [facility] Patient to be transferred to facility by: EMS   Patient and family notified of of transfer: 06/06/20  Discharge Plan and Services                                     Social Determinants of Health (SDOH) Interventions     Readmission Risk Interventions No flowsheet data found.

## 2020-06-06 NOTE — TOC Progression Note (Signed)
Transition of Care [facility]) - Progression Note    Patient Details  Name: Ebony Torres MRN: [id number] Date of Birth: October 14, 1930  Transition [facility] Care [facility]) CM/SW Contact  Caryn Section, RN Phone Number: 06/06/2020, 11:26 AM  Clinical Narrative:   TOC in to see patient and granddaughter at bedside.  Patient's only bed offer is [facility].  Presented bed offer, patient amenable.  [facility] aware of choice.  TOC contact information given, TOC to follow through discharge.         Expected Discharge Plan and Services                                                 Social Determinants of Health (SDOH) Interventions    Readmission Risk Interventions No flowsheet data found.

## 2020-06-06 NOTE — Progress Notes (Signed)
Report called to [facility], [facility]. All questions addressed at time of call.

## 2020-06-06 NOTE — Discharge Summary (Signed)
Physician Discharge Summary  Ebony Torres WUJ:[id number] DOB: 02/14/1930 DOA: 06/03/2020  PCP: Marguarite ArbourSparks, Jeffrey D, MD  Admit date: 06/03/2020 Discharge date: 06/06/2020  Admitted From: Home Discharge disposition: [facility]   Code Status: DNR  Diet Recommendation: Dysphagia 3 diet  Discharge Diagnosis:   Active Problems:   Unable to walk   Chief Complaint  Patient presents with  . Weakness   Brief narrative: Ebony Torres is a [age] y.o. female with PMH significant for DM2, HTN, HLD, A. fib, GERD, glaucoma Patient presents to the ED on 5/20 with acute onset generalized weakness , unable to ambulate, suprapubic pain with no other urinary symptoms In the ED, patient was afebrile, heart rate 101, blood pressure 161/78 Labs with WBC count of 11.9, creatinine 1.54 Urinalysis with cloudy amber urine, large amount leukocytes and many bacteria Patient was given IV Rocephin, IV fluid and admitted to hospitalist service. See below for details  Subjective: Patient was seen and examined this morning. Propped up in bed.  Not in distress.  Diarrhea seems to have slowed down.  More soft stool last night. No fever.  T-max of 100 GI pathogen panel negative.  Assessment/Plan: Symptomatic UTI -Presented with generalized weakness, suprapubic pain -Urinalysis with cloudy amber urine, large amount leukocytes and many bacteria -Currently improving on IV Rocephin.  Urine culture is growing multiple species. -WBC count improving.  Keflex for 5 more days at discharge. Recent Labs  Lab 06/03/20 2004 06/04/20 0611 06/05/20 0613  WBC 11.9* 10.4 9.3   Persistent diarrhea for weeks -GI pathogen panel negative.  Patient states stool is more soft now. Continue to monitor. -As needed Imodium.  AKI -Unclear baseline creatinine. Presented with a creatinine elevated at 1.54, improving with hydration.  -Encourage oral hydration Recent Labs    06/03/20 2004 06/04/20 0611 06/05/20 0613   BUN 30* 24* 20  CREATININE 1.54* 1.23* 1.13*   Hypokalemia -Improved with replacement. Continue to monitor. Recent Labs  Lab 06/03/20 2004 06/04/20 [id number] 06/05/20 0613  K 3.4* 3.9 3.5  MG  --  1.8  --    Generalized weakness -Probably related to UTI and dehydration -PT eval obtained.  SNF recommended.  Dysphagia -As part of generalized weakness.  Dysphagia 3 diet at discharge.  GERD -Heartburn and abdominal pain -Takes omeprazole at home. Continue PPI.  Microcytic anemia -Unclear baseline hemoglobin. Currently hemoglobin between 9-10. No active bleeding but hemoglobin seems to be gradually dropping. Suspect dilutional effect. Recent Labs    06/03/20 2004 06/04/20 0611 06/05/20 0613  HGB 9.6* 9.0* 8.4*  MCV 79.5* 80.8 82.4   Essential hypertension. -Home meds include Toprol, losartan and HCTZ -Currently on Toprol and blood pressure is in normal range.  I would resume losartan at a lower dose.  Continue to hold HCTZ.    Type 2 diabetes mellitus -A1c 6.1 on 5/21. -Seems diet controlled Recent Labs  Lab 06/05/20 1204 06/05/20 1551 06/05/20 2041 06/06/20 0750 06/06/20 1144  GLUCAP 126* 155* 133* 102* 143*   Glaucoma -Continue eyedrops   Dyslipidemia -Patient does not seem to be taking Lipitor   Wound care:    Discharge Exam:   Vitals:   06/06/20 0009 06/06/20 0538 06/06/20 0749 06/06/20 1142  BP: 135/66 118/64 116/60 (!) 104/50  Pulse: (!) 110 (!) 102 93 (!) 108  Resp: 20 14 18 20   Temp: 99.8 F (37.7 C) 98.5 F (36.9 C) 98.4 F (36.9 C) 98.7 F (37.1 C)  TempSrc:    Oral  SpO2:  99% 100% 100% 100%  Weight:      Height:        Body mass index is 19.52 kg/m.  General exam: Pleasant elderly African-American female. Skin: No rashes, lesions or ulcers. HEENT: Atraumatic, normocephalic, no obvious bleeding Lungs: Clear to auscultation bilaterally CVS: Regular rate and rhythm, no murmur GI/Abd soft, nontender, nondistended, bowel sound  present CNS: Alert, awake, slow to respond but oriented to place and person Psychiatry: Mood appropriate Extremities: Chronic mild stasis changes.  No edema, no calf tenderness  Follow ups:   Discharge Instructions    Diet general   Complete by: As directed    Dysphagia 3 diet   Increase activity slowly   Complete by: As directed       Contact information for follow-up providers    Marguarite Arbour, MD.   Specialty: Internal Medicine Contact information: [address] [place] [address] [phone]            Contact information for after-discharge care    Destination    [facility] Preferred SNF .   Service: Skilled Nursing Contact information: [address] [place] [address] [phone]                  Recommendations for Outpatient Follow-Up:   1. Follow-up with PCP as an outpatient  Discharge Instructions:  Follow with Primary MD Marguarite Arbour, MD in 7 days   Get CBC/BMP checked in next visit within 1 week by PCP or SNF MD ( we routinely change or add medications that can affect your baseline labs and fluid status, therefore we recommend that you get the mentioned basic workup next visit with your PCP, your PCP may decide not to get them or add new tests based on their clinical decision)  On your next visit with your PCP, please Get Medicines reviewed and adjusted.  Please request your PCP  to go over all Hospital Tests and Procedure/Radiological results at the follow up, please get all Hospital records sent to your Prim MD by signing hospital release before you go home.  Activity: As tolerated with Full fall precautions use walker/cane & assistance as needed  For Heart failure patients - Check your Weight same time everyday, if you gain over 2 pounds, or you develop in leg swelling, experience more shortness of breath or chest pain, call your Primary MD immediately. Follow Cardiac Low  Salt Diet and 1.5 lit/day fluid restriction.  If you have smoked or chewed Tobacco in the last 2 yrs please stop smoking, stop any regular Alcohol  and or any Recreational drug use.  If you experience worsening of your admission symptoms, develop shortness of breath, life threatening emergency, suicidal or homicidal thoughts you must seek medical attention immediately by calling 911 or calling your MD immediately  if symptoms less severe.  You Must read complete instructions/literature along with all the possible adverse reactions/side effects for all the Medicines you take and that have been prescribed to you. Take any new Medicines after you have completely understood and accpet all the possible adverse reactions/side effects.   Do not drive, operate heavy machinery, perform activities at heights, swimming or participation in water activities or provide baby sitting services if your were admitted for syncope or siezures until you have seen by Primary MD or a Neurologist and advised to do so again.  Do not drive when taking Pain medications.  Do not take more than prescribed Pain,  Sleep and Anxiety Medications  Wear Seat belts while driving.   Please note You were cared for by a hospitalist during your hospital stay. If you have any questions about your discharge medications or the care you received while you were in the hospital after you are discharged, you can call the unit and asked to speak with the hospitalist on call if the hospitalist that took care of you is not available. Once you are discharged, your primary care physician will handle any further medical issues. Please note that NO REFILLS for any discharge medications will be authorized once you are discharged, as it is imperative that you return to your primary care physician (or establish a relationship with a primary care physician if you do not have one) for your aftercare needs so that they can reassess your need for medications and  monitor your lab values.    Allergies as of 06/06/2020   No Known Allergies     Medication List    STOP taking these medications   atorvastatin 20 MG tablet Commonly known as: LIPITOR   losartan-hydrochlorothiazide 100-25 MG tablet Commonly known as: HYZAAR     TAKE these medications   acidophilus Caps capsule Take 2 capsules by mouth daily for 7 days. Start taking on: Jun 07, 2020   aspirin EC 81 MG tablet Take 81 mg by mouth daily. Swallow whole.   cephALEXin 500 MG capsule Commonly known as: KEFLEX Take 1 capsule (500 mg total) by mouth 3 (three) times daily for 5 days.   feeding supplement Liqd Take 237 mLs by mouth 2 (two) times daily between meals. Start taking on: Jun 07, 2020   loperamide 2 MG capsule Commonly known as: IMODIUM Take 1 capsule (2 mg total) by mouth 2 (two) times daily as needed for diarrhea or loose stools.   losartan 50 MG tablet Commonly known as: Cozaar Take 1 tablet (50 mg total) by mouth daily.   metoprolol succinate 50 MG 24 hr tablet Commonly known as: TOPROL-XL Take 50 mg by mouth daily.   MULTIVITAMIN PO Take 1 tablet by mouth daily.   TRAVATAN Z OP Apply to eye. One drop at bedtime in each eye       Time coordinating discharge: 35 minutes  The results of significant diagnostics from this hospitalization (including imaging, microbiology, ancillary and laboratory) are listed below for reference.    Procedures and Diagnostic Studies:   No results found.   Labs:   Basic Metabolic Panel: Recent Labs  Lab 06/03/20 2004 06/04/20 8119 06/05/20 0613  NA 141 141 143  K 3.4* 3.9 3.5  CL 107 110 114*  CO2 GLUCOSE 107* 107* 101*  BUN 30* 24* 20  CREATININE 1.54* 1.23* 1.13*  CALCIUM 9.1 8.6* 8.7*  MG  --  1.8  --    GFR Estimated Creatinine Clearance: 26.8 mL/min (A) (by C-G formula based on SCr of 1.13 mg/dL (H)). Liver Function Tests: No results for input(s): AST, ALT, ALKPHOS, BILITOT, PROT, ALBUMIN  in the last 168 hours. No results for input(s): LIPASE, AMYLASE in the last 168 hours. No results for input(s): AMMONIA in the last 168 hours. Coagulation profile No results for input(s): INR, PROTIME in the last 168 hours.  CBC: Recent Labs  Lab 06/03/20 2004 06/04/20 0611 06/05/20 0613  WBC 11.9* 10.4 9.3  NEUTROABS  --   --  6.8  HGB 9.6* 9.0* 8.4*  HCT 27.6* 26.5* 25.2*  MCV 79.5* 80.8 82.4  PLT 280 246 233   Cardiac Enzymes: No results for input(s): CKTOTAL, CKMB, CKMBINDEX, TROPONINI in the last 168 hours. BNP: Invalid input(s): POCBNP CBG: Recent Labs  Lab 06/05/20 1204 06/05/20 1551 06/05/20 2041 06/06/20 0750 06/06/20 1144  GLUCAP 126* 155* 133* 102* 143*   D-Dimer No results for input(s): DDIMER in the last 72 hours. Hgb A1c Recent Labs    06/04/20 0611  HGBA1C 6.1*   Lipid Profile No results for input(s): CHOL, HDL, LDLCALC, TRIG, CHOLHDL, LDLDIRECT in the last 72 hours. Thyroid function studies No results for input(s): TSH, T4TOTAL, T3FREE, THYROIDAB in the last 72 hours.  Invalid input(s): FREET3 Anemia work up No results for input(s): VITAMINB12, FOLATE, FERRITIN, TIBC, IRON, RETICCTPCT in the last 72 hours. Microbiology Recent Results (from the past 240 hour(s))  Urine culture     Status: Abnormal   Collection Time: 06/03/20 10:20 PM   Specimen: Urine, Random  Result Value Ref Range Status   Specimen Description   Final    URINE, RANDOM Performed at [facility], [address], [place], [place] [address]    Special Requests   Final    NONE Performed at [facility], [address] [place], [place] [address]    Culture MULTIPLE SPECIES PRESENT, SUGGEST RECOLLECTION (A)  Final   Report Status 06/05/2020 FINAL  Final  Resp Panel by RT-PCR (Flu A&B, Covid) Nasopharyngeal Swab     Status: None   Collection Time: 06/04/20 12:02 AM   Specimen: Nasopharyngeal Swab; Nasopharyngeal(NP) swabs in vial transport medium   Result Value Ref Range Status   SARS Coronavirus 2 by RT PCR NEGATIVE NEGATIVE Final    Comment: (NOTE) SARS-CoV-2 target nucleic acids are NOT DETECTED.  The SARS-CoV-2 RNA is generally detectable in upper respiratory specimens during the acute phase of infection. The lowest concentration of SARS-CoV-2 viral copies this assay can detect is 138 copies/mL. A negative result does not preclude SARS-Cov-2 infection and should not be used as the sole basis for treatment or other patient management decisions. A negative result may occur with  improper specimen collection/handling, submission of specimen other than nasopharyngeal swab, presence of viral mutation(s) within the areas targeted by this assay, and inadequate number of viral copies(<138 copies/mL). A negative result must be combined with clinical observations, patient history, and epidemiological information. The expected result is Negative.  Fact Sheet for Patients:  BloggerCourse.com  Fact Sheet for Healthcare Providers:  SeriousBroker.it  This test is no t yet approved or cleared by the [place] FDA and  has been authorized for detection and/or diagnosis of SARS-CoV-2 by FDA under an Emergency Use Authorization (EUA). This EUA will remain  in effect (meaning this test can be used) for the duration of the COVID-19 declaration under Section 564(b)(1) of the Act, 21 U.S.C.section 360bbb-3(b)(1), unless the authorization is terminated  or revoked sooner.       Influenza A by PCR NEGATIVE NEGATIVE Final   Influenza B by PCR NEGATIVE NEGATIVE Final    Comment: (NOTE) The Xpert Xpress SARS-CoV-2/FLU/RSV plus assay is intended as an aid in the diagnosis of influenza from Nasopharyngeal swab specimens and should not be used as a sole basis for treatment. Nasal washings and aspirates are unacceptable for Xpert Xpress SARS-CoV-2/FLU/RSV testing.  Fact Sheet for  Patients: BloggerCourse.com  Fact Sheet for Healthcare Providers: SeriousBroker.it  This test is not yet approved or cleared by the [place] FDA and has been authorized for detection and/or diagnosis of SARS-CoV-2 by  FDA under an Emergency Use Authorization (EUA). This EUA will remain in effect (meaning this test can be used) for the duration of the COVID-19 declaration under Section 564(b)(1) of the Act, 21 U.S.C. section 360bbb-3(b)(1), unless the authorization is terminated or revoked.  Performed at [facility], [address] [place], [place] [address]   Gastrointestinal Panel by PCR , Stool     Status: None   Collection Time: 06/05/20  7:15 PM   Specimen: Stool  Result Value Ref Range Status   Campylobacter species NOT DETECTED NOT DETECTED Final   Plesimonas shigelloides NOT DETECTED NOT DETECTED Final   Salmonella species NOT DETECTED NOT DETECTED Final   Yersinia enterocolitica NOT DETECTED NOT DETECTED Final   Vibrio species NOT DETECTED NOT DETECTED Final   Vibrio cholerae NOT DETECTED NOT DETECTED Final   Enteroaggregative E coli (EAEC) NOT DETECTED NOT DETECTED Final   Enteropathogenic E coli (EPEC) NOT DETECTED NOT DETECTED Final   Enterotoxigenic E coli (ETEC) NOT DETECTED NOT DETECTED Final   Shiga like toxin producing E coli (STEC) NOT DETECTED NOT DETECTED Final   Shigella/Enteroinvasive E coli (EIEC) NOT DETECTED NOT DETECTED Final   Cryptosporidium NOT DETECTED NOT DETECTED Final   Cyclospora cayetanensis NOT DETECTED NOT DETECTED Final   Entamoeba histolytica NOT DETECTED NOT DETECTED Final   Giardia lamblia NOT DETECTED NOT DETECTED Final   Adenovirus F40/41 NOT DETECTED NOT DETECTED Final   Astrovirus NOT DETECTED NOT DETECTED Final   Norovirus GI/GII NOT DETECTED NOT DETECTED Final   Rotavirus A NOT DETECTED NOT DETECTED Final   Sapovirus (I, II, IV, and V) NOT DETECTED NOT DETECTED Final     Comment: Performed at [facility] [address] [place], [place] [address]     Signed: Lorin Glass  [facility] 06/06/2020, 1:19 PM

## 2020-07-26 ENCOUNTER — Emergency Department
Admission: EM | Admit: 2020-07-26 | Discharge: 2020-07-26 | Disposition: A | Discharge status: Home / Self Care | Payer: Medicare Other | Attending: Emergency Medicine | Admitting: Emergency Medicine

## 2020-07-26 ENCOUNTER — Emergency Department: Payer: Medicare Other

## 2020-07-26 ENCOUNTER — Encounter: Payer: Self-pay | Admitting: Emergency Medicine

## 2020-07-26 ENCOUNTER — Other Ambulatory Visit: Payer: Self-pay

## 2020-07-26 DIAGNOSIS — I10 Essential (primary) hypertension: Secondary | ICD-10-CM | POA: Diagnosis not present

## 2020-07-26 DIAGNOSIS — M79604 Pain in right leg: Secondary | ICD-10-CM | POA: Diagnosis present

## 2020-07-26 DIAGNOSIS — M79606 Pain in leg, unspecified: Secondary | ICD-10-CM

## 2020-07-26 DIAGNOSIS — I825Y3 Chronic embolism and thrombosis of unspecified deep veins of proximal lower extremity, bilateral: Secondary | ICD-10-CM | POA: Insufficient documentation

## 2020-07-26 DIAGNOSIS — Z7901 Long term (current) use of anticoagulants: Secondary | ICD-10-CM | POA: Diagnosis not present

## 2020-07-26 DIAGNOSIS — Z79899 Other long term (current) drug therapy: Secondary | ICD-10-CM | POA: Insufficient documentation

## 2020-07-26 DIAGNOSIS — E119 Type 2 diabetes mellitus without complications: Secondary | ICD-10-CM | POA: Diagnosis not present

## 2020-07-26 DIAGNOSIS — I4892 Unspecified atrial flutter: Secondary | ICD-10-CM | POA: Diagnosis not present

## 2020-07-26 DIAGNOSIS — Z7982 Long term (current) use of aspirin: Secondary | ICD-10-CM | POA: Insufficient documentation

## 2020-07-26 DIAGNOSIS — I82593 Chronic embolism and thrombosis of other specified deep vein of lower extremity, bilateral: Secondary | ICD-10-CM

## 2020-07-26 LAB — CBC
HCT: 26 % — ABNORMAL LOW (ref 36.0–46.0)
Hemoglobin: 8.5 g/dL — ABNORMAL LOW (ref 12.0–15.0)
MCH: 25.4 pg — ABNORMAL LOW (ref 26.0–34.0)
MCHC: 32.7 g/dL (ref 30.0–36.0)
MCV: 77.6 fL — ABNORMAL LOW (ref 80.0–100.0)
Platelets: 319 10*3/uL (ref 150–400)
RBC: 3.35 MIL/uL — ABNORMAL LOW (ref 3.87–5.11)
RDW: 16.1 % — ABNORMAL HIGH (ref 11.5–15.5)
WBC: 10.8 10*3/uL — ABNORMAL HIGH (ref 4.0–10.5)
nRBC: 0 % (ref 0.0–0.2)

## 2020-07-26 LAB — COMPREHENSIVE METABOLIC PANEL
ALT: 11 U/L (ref 0–44)
AST: 14 U/L — ABNORMAL LOW (ref 15–41)
Albumin: 2.7 g/dL — ABNORMAL LOW (ref 3.5–5.0)
Alkaline Phosphatase: 60 U/L (ref 38–126)
Anion gap: 5 (ref 5–15)
BUN: 24 mg/dL — ABNORMAL HIGH (ref 8–23)
CO2: 28 mmol/L (ref 22–32)
Calcium: 9.4 mg/dL (ref 8.9–10.3)
Chloride: 107 mmol/L (ref 98–111)
Creatinine, Ser: 0.98 mg/dL (ref 0.44–1.00)
GFR, Estimated: 56 mL/min — ABNORMAL LOW (ref >60)
Glucose, Bld: 99 mg/dL (ref 70–99)
Potassium: 4 mmol/L (ref 3.5–5.1)
Sodium: 140 mmol/L (ref 135–145)
Total Bilirubin: 0.6 mg/dL (ref 0.3–1.2)
Total Protein: 6.2 g/dL — ABNORMAL LOW (ref 6.5–8.1)

## 2020-07-26 MED ORDER — APIXABAN 2.5 MG PO TABS: 2.5000 mg | ORAL_TABLET | Freq: Two times a day (BID) | ORAL | 1 refills | Status: AC

## 2020-07-26 NOTE — ED Provider Notes (Signed)
[facility] Emergency Department Provider Note   ____________________________________________    I have reviewed the triage vital signs and the nursing notes.   HISTORY  Chief Complaint blood clots     HPI Ebony Torres is a 87 y.o. female with a history as noted below who presents with complaints of blood clots in her legs.  Patient is from a nursing facility, she has been bedbound for the last several months.  Apparently had an ultrasound recently which demonstrated blood clots in both legs.  She does complain of some cramping in her right leg in particular.  No chest pain or shortness of breath.  No pleurisy.  She is not on blood thinners  Past Medical History:  Diagnosis Date   Atrial fibrillation (HCC)    Diabetes (HCC)    GERD (gastroesophageal reflux disease)    Glaucoma    Hypertension     Patient Active Problem List   Diagnosis Date Noted   Unable to walk 06/04/2020    Past Surgical History:  Procedure Laterality Date   ABDOMINAL HYSTERECTOMY     HEMORRHOID SURGERY      Prior to Admission medications   Medication Sig Start Date End Date Taking? Authorizing Provider  acetaminophen (TYLENOL) 325 MG tablet Take 650 mg by mouth every 6 (six) hours as needed.   Yes [provider]  apixaban (ELIQUIS) 2.5 MG TABS tablet Take 1 tablet (2.5 mg total) by mouth 2 (two) times daily. 07/26/20  Yes Jene Every, MD  aspirin EC 81 MG tablet Take 81 mg by mouth daily. Swallow whole.   Yes [provider]  ergocalciferol (VITAMIN D2) 1.25 MG (50000 UT) capsule Take 50,000 Units by mouth every Monday.   Yes [provider]  ferrous sulfate 325 (65 FE) MG tablet Take 325 mg by mouth 3 (three) times a week. Monday, Wednesday and friday   Yes [provider]  fluticasone (FLONASE) 50 MCG/ACT nasal spray Place 1 spray into both nostrils daily.   Yes [provider]  loperamide (IMODIUM) 2 MG capsule Take 1  capsule (2 mg total) by mouth 2 (two) times daily as needed for diarrhea or loose stools. 06/06/20  Yes Dahal, Melina Schools, MD  losartan (COZAAR) 50 MG tablet Take 1 tablet (50 mg total) by mouth daily. 06/06/20 07/26/20 Yes Dahal, Melina Schools, MD  metoprolol succinate (TOPROL-XL) 50 MG 24 hr tablet Take 50 mg by mouth daily. 03/21/20  Yes [provider]  Multiple Vitamins-Minerals (MULTIVITAMIN PO) Take 1 tablet by mouth daily.   Yes [provider]  Phenylephrine-DM-GG (ROBITUSSIN CHILD COUGH/COLD CF) 2.05-19-48 MG/5ML LIQD Take 10 mLs by mouth every 6 (six) hours as needed.   Yes [provider]  rifaximin (XIFAXAN) 550 MG TABS tablet Take 550 mg by mouth 3 (three) times daily.   Yes [provider]  simethicone (MYLICON) 80 MG chewable tablet Chew 80 mg by mouth 3 (three) times daily.   Yes [provider]  Travoprost (TRAVATAN Z OP) Place 1 drop into both eyes at bedtime. One drop at bedtime in each eye   Yes [provider]  feeding supplement (ENSURE ENLIVE / ENSURE PLUS) LIQD Take 237 mLs by mouth 2 (two) times daily between meals. 06/07/20   Lorin Glass, MD  omeprazole (PRILOSEC) 40 MG capsule Take 40 mg by mouth daily. Patient not taking: Reported on 07/26/2020 03/21/20   [provider]     Allergies Patient has no known allergies.  Family History  Problem Relation Age of Onset   Breast cancer Neg Hx     Social History Social History   Tobacco Use   Smoking status: Never   Smokeless tobacco: Never  Substance Use Topics   Alcohol use: No    Review of Systems  Constitutional: No fever/chills Eyes: No visual changes.  ENT: No sore throat. Cardiovascular: Denies chest pain. Respiratory: Denies shortness of breath. Gastrointestinal: No abdominal pain.  No nausea, no vomiting.   Genitourinary: Negative for dysuria. Musculoskeletal: As above Skin: Negative for rash. Neurological: Negative for headaches or  weakness   ____________________________________________   PHYSICAL EXAM:  VITAL SIGNS: ED Triage Vitals  Enc Vitals Group     BP 07/26/20 1223 (!) 149/65     Pulse Rate 07/26/20 1223 77     Resp 07/26/20 1223 18     Temp 07/26/20 1223 99 F (37.2 C)     Temp Source 07/26/20 1223 Oral     SpO2 07/26/20 1220 100 %     Weight 07/26/20 1222 54.9 kg (121 lb)     Height 07/26/20 1222 1.575 m (5\' 2" )     Head Circumference --      Peak Flow --      Pain Score 07/26/20 1222 0     Pain Loc --      Pain Edu? --      Excl. in GC? --     Constitutional: Alert and oriented. No acute distress. Pleasant and interactive  Cardiovascular: Normal rate, regular rhythm. Grossly normal heart sounds.  Good peripheral circulation. Respiratory: Normal respiratory effort.  No retractions. Lungs CTAB. Gastrointestinal: Soft and nontender. No distention.  No CVA tenderness. Genitourinary: deferred Musculoskeletal: Mild ankle swelling bilaterally, no significant calf swelling.  Warm and well perfused Neurologic:  Normal speech and language. No gross focal neurologic deficits are appreciated.  Skin:  Skin is warm, dry and intact. No rash noted. Psychiatric: Mood and affect are normal. Speech and behavior are normal.  ____________________________________________   LABS (all labs ordered are listed, but only abnormal results are displayed)  Labs Reviewed  CBC - Abnormal; Notable for the following components:      Result Value   WBC 10.8 (*)    RBC 3.35 (*)    Hemoglobin 8.5 (*)    HCT 26.0 (*)    MCV 77.6 (*)    MCH 25.4 (*)    RDW 16.1 (*)    All other components within normal limits  COMPREHENSIVE METABOLIC PANEL - Abnormal; Notable for the following components:   BUN 24 (*)    Total Protein 6.2 (*)    Albumin 2.7 (*)    AST 14 (*)    GFR, Estimated 56 (*)    All other components within normal limits   ____________________________________________  EKG  ED ECG REPORT I, 09/26/20, the attending physician, personally viewed and interpreted this ECG.  Date: 07/26/2020  Rhythm: n atrial flutter QRS Axis: normal Intervals: Abnormal ST/T Wave abnormalities: normal Narrative Interpretation: no evidence of acute ischemia  ____________________________________________  RADIOLOGY  Ultrasound reviewed by me, consistent with DVT ____________________________________________   PROCEDURES  Procedure(s) performed: No  Procedures   Critical Care performed: No ____________________________________________   INITIAL IMPRESSION / ASSESSMENT AND PLAN / ED COURSE  Pertinent labs & imaging results that were available during my care of the patient were reviewed by me and considered in my medical decision making (see chart for details).   Patient with  reported DVT, sent here for repeat ultrasound and treatment.  Ultrasound consistent with likely chronic nonocclusive DVT 1 area of partially occlusive DVT, discussed with Dr. Wyn Quaker vascular surgery who recommends 2.5 mg twice daily Eliquis and outpatient follow-up with him.  Discussed risks and benefits of blood thinners, patient's hemoglobin is stable.    ____________________________________________   FINAL CLINICAL IMPRESSION(S) / ED DIAGNOSES  Final diagnoses:  Leg pain  Chronic deep vein thrombosis (DVT) of other vein of both lower extremities (HCC)        Note:  This document was prepared using Dragon voice recognition software and may include unintentional dictation errors.    Jene Every, MD 07/26/20 [phone]

## 2020-07-26 NOTE — ED Notes (Signed)
[facility]  CALLED  FOR  TRANSPORT  TO  [facility] 

## 2020-07-26 NOTE — ED Triage Notes (Signed)
Patient arrives via [facility] from [facility] for blood clots. Patient has a bilateral US done at the facility today showing 2 clots in right leg and 1 clot in the left leg. Patient Aox4.

## 2020-07-26 NOTE — ED Notes (Signed)
Kinner, MD at bedside. 

## 2020-07-26 NOTE — ED Notes (Signed)
Patient cleaned by Doreatha Martin, RN and Faith, NT. Patient given new brief and placed on purewick.

## 2020-12-04 IMAGING — MG DIGITAL SCREENING BILATERAL MAMMOGRAM WITH TOMO AND CAD
6 of 10 series · 6 of 30 positions shown · non-contrast
Comparison: Previous exam(s).

CLINICAL DATA: Screening.

EXAM:
DIGITAL SCREENING BILATERAL MAMMOGRAM WITH TOMO AND CAD

[R MLO synth-2D (1 of 2)]
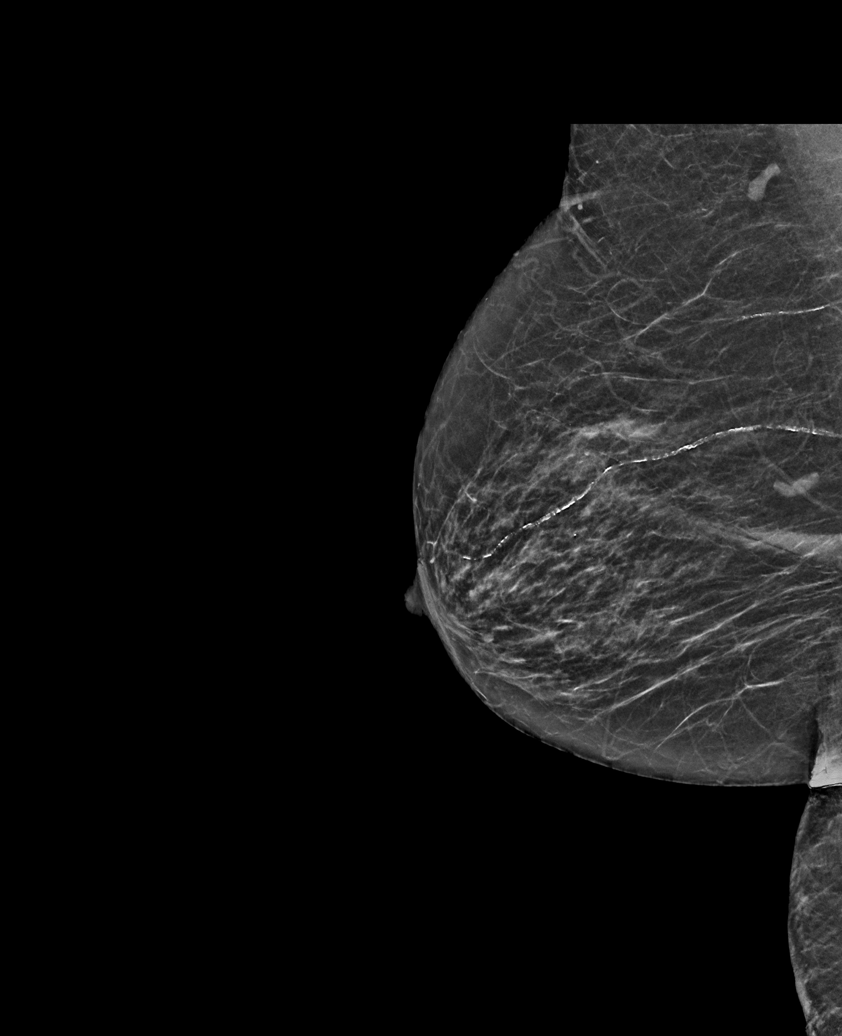

[L MLO synth-2D]
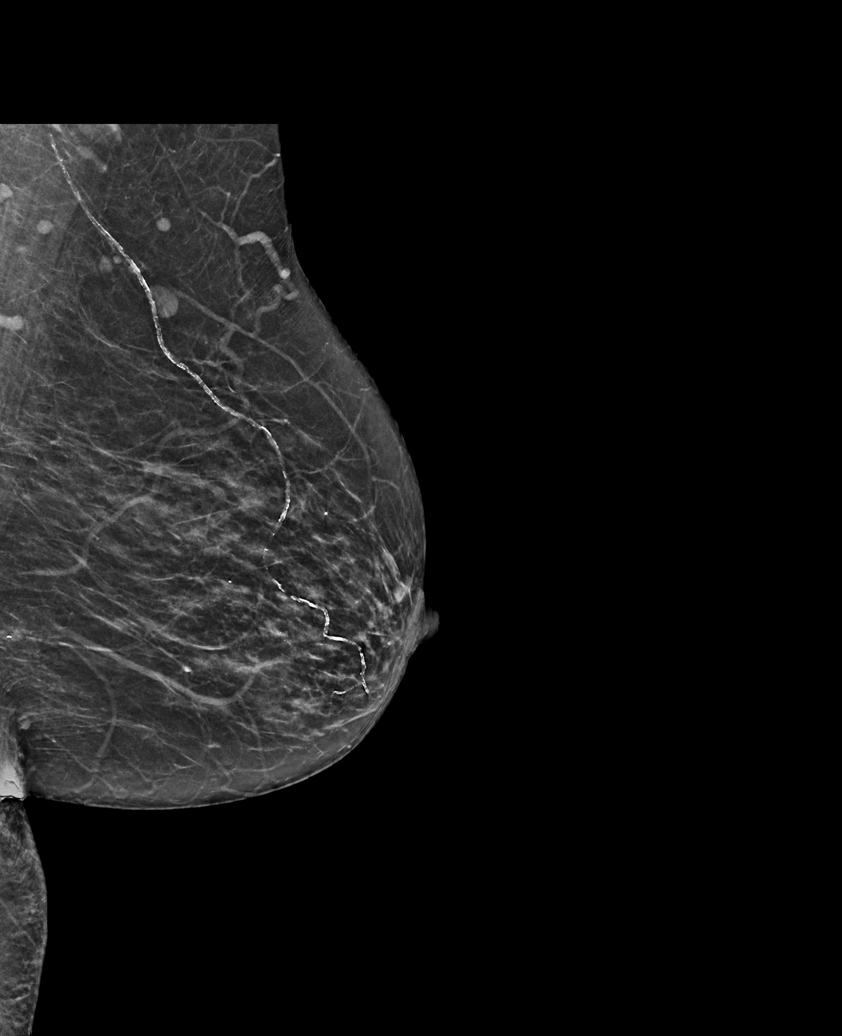

[R CC synth-2D]
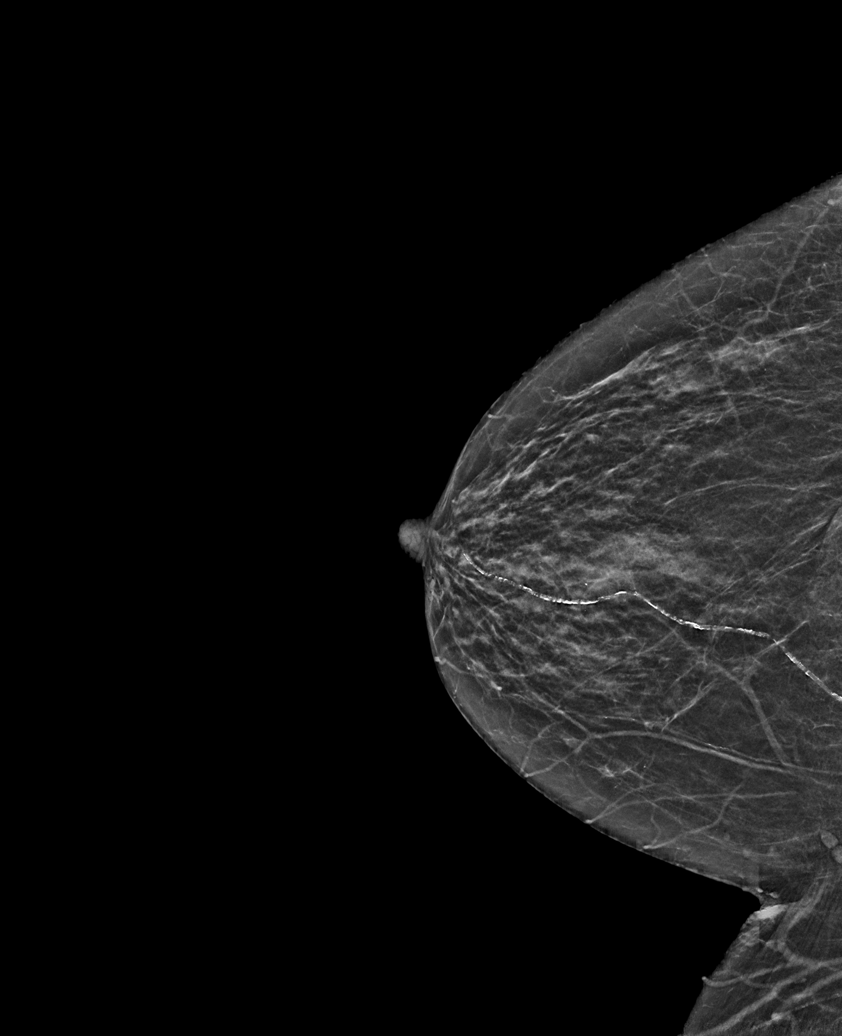

[R MLO synth-2D (2 of 2)]
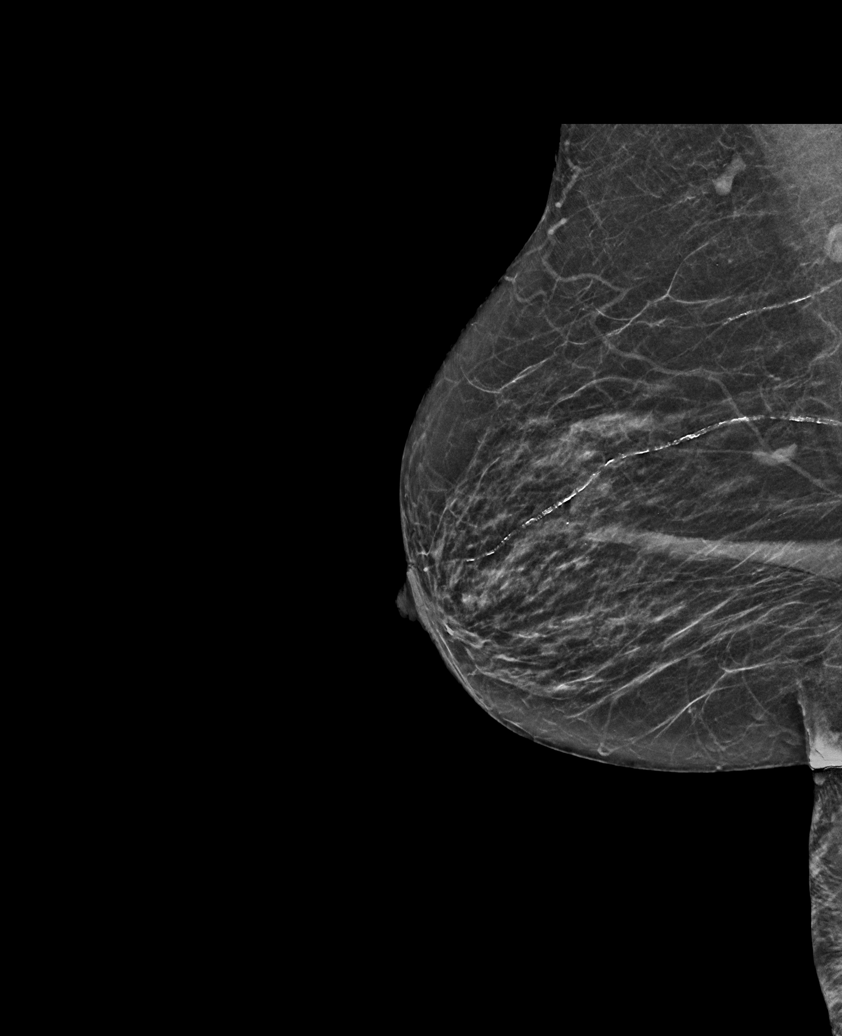

[L CC synth-2D]
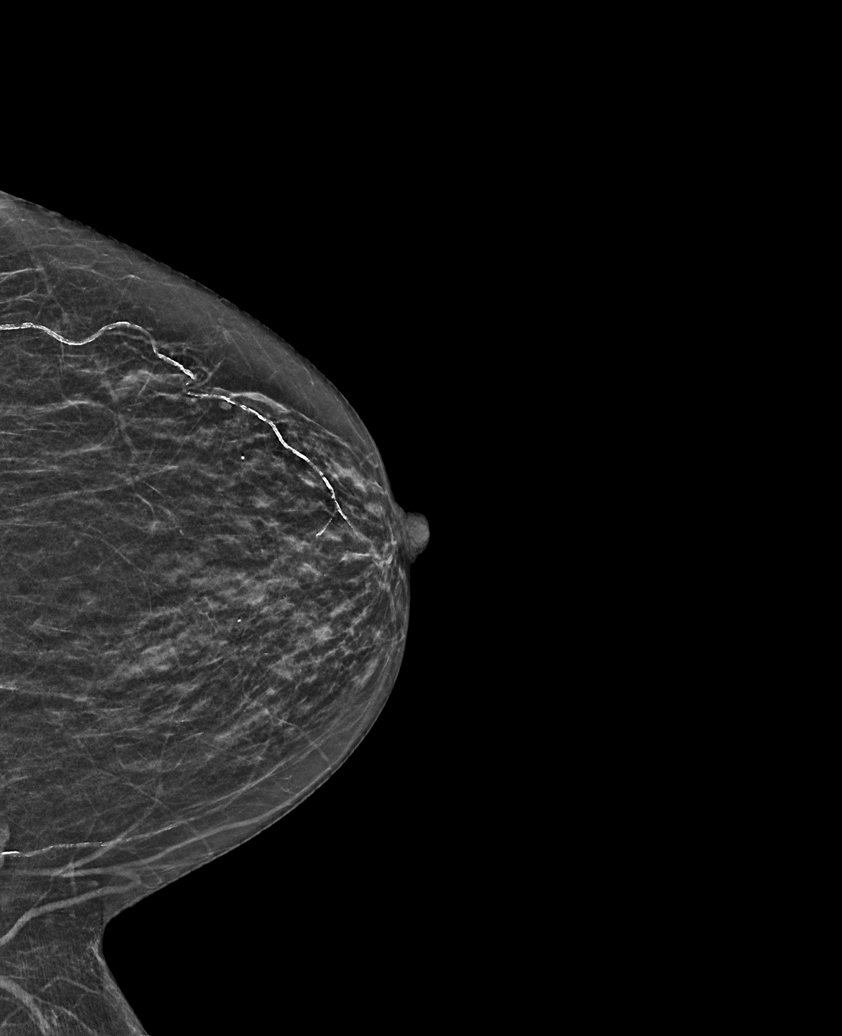

[R MLO tomo · tomo slice 23/46.0]
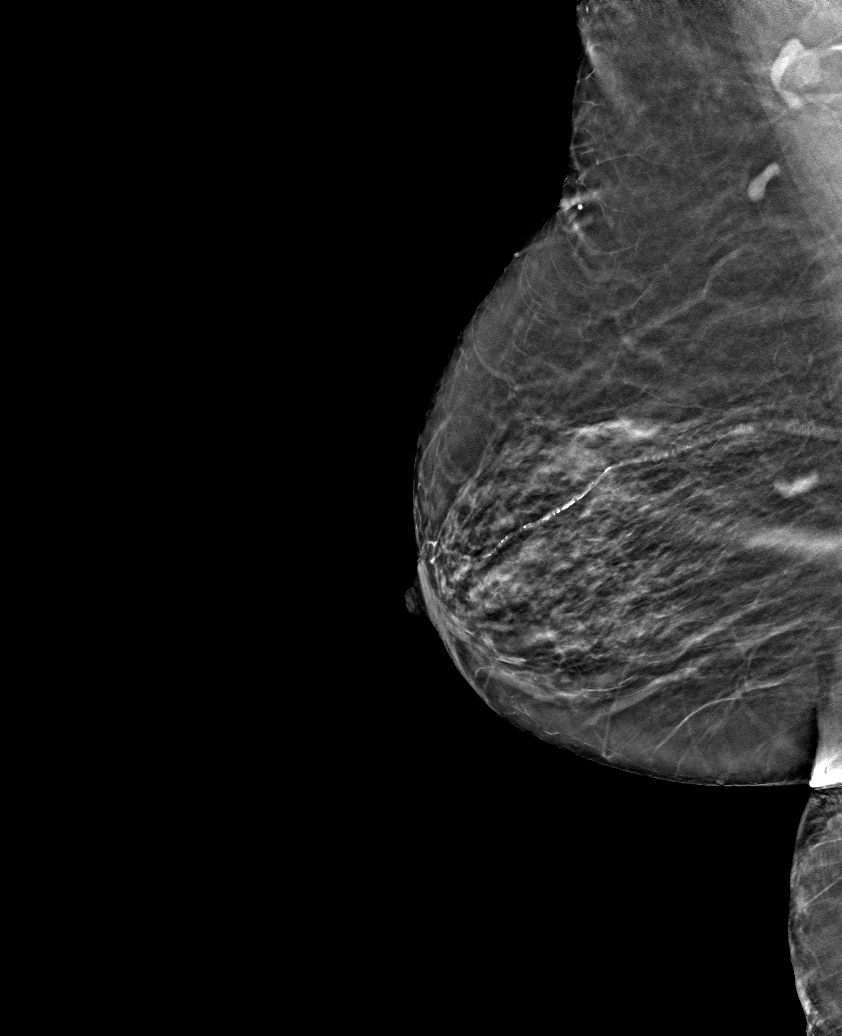

[6 of 30 positions shown; findings below may reference images not displayed]

ACR Breast Density Category b: There are scattered areas of
fibroglandular density.
FINDINGS: There are no findings suspicious for malignancy. Images were
processed with CAD.
IMPRESSION: No mammographic evidence of malignancy. A result letter of this
screening mammogram will be mailed directly to the patient.

RECOMMENDATION:
Screening mammogram in one year. (Code:[id number])

BI-RADS CATEGORY  1: Negative.

## 2021-10-04 ENCOUNTER — Other Ambulatory Visit: Payer: Self-pay

## 2021-10-04 ENCOUNTER — Inpatient Hospital Stay
Admission: EM | Admit: 2021-10-04 | Discharge: 2021-10-15 | DRG: 871 | Disposition: E | Payer: Medicare Other | Source: Skilled Nursing Facility | Attending: Pulmonary Disease | Admitting: Pulmonary Disease

## 2021-10-04 ENCOUNTER — Emergency Department: Payer: Medicare Other

## 2021-10-04 ENCOUNTER — Encounter: Payer: Self-pay | Admitting: Emergency Medicine

## 2021-10-04 DIAGNOSIS — Z7901 Long term (current) use of anticoagulants: Secondary | ICD-10-CM

## 2021-10-04 DIAGNOSIS — N170 Acute kidney failure with tubular necrosis: Secondary | ICD-10-CM | POA: Diagnosis present

## 2021-10-04 DIAGNOSIS — Z86718 Personal history of other venous thrombosis and embolism: Secondary | ICD-10-CM

## 2021-10-04 DIAGNOSIS — K567 Ileus, unspecified: Secondary | ICD-10-CM | POA: Diagnosis present

## 2021-10-04 DIAGNOSIS — I959 Hypotension, unspecified: Secondary | ICD-10-CM | POA: Diagnosis present

## 2021-10-04 DIAGNOSIS — A4151 Sepsis due to Escherichia coli [E. coli]: Principal | ICD-10-CM | POA: Diagnosis present

## 2021-10-04 DIAGNOSIS — E119 Type 2 diabetes mellitus without complications: Secondary | ICD-10-CM | POA: Diagnosis present

## 2021-10-04 DIAGNOSIS — Z9981 Dependence on supplemental oxygen: Secondary | ICD-10-CM

## 2021-10-04 DIAGNOSIS — D638 Anemia in other chronic diseases classified elsewhere: Secondary | ICD-10-CM | POA: Diagnosis present

## 2021-10-04 DIAGNOSIS — J189 Pneumonia, unspecified organism: Secondary | ICD-10-CM | POA: Diagnosis present

## 2021-10-04 DIAGNOSIS — R600 Localized edema: Secondary | ICD-10-CM | POA: Diagnosis present

## 2021-10-04 DIAGNOSIS — I119 Hypertensive heart disease without heart failure: Secondary | ICD-10-CM | POA: Diagnosis present

## 2021-10-04 DIAGNOSIS — Z79899 Other long term (current) drug therapy: Secondary | ICD-10-CM

## 2021-10-04 DIAGNOSIS — Z66 Do not resuscitate: Secondary | ICD-10-CM | POA: Diagnosis not present

## 2021-10-04 DIAGNOSIS — K529 Noninfective gastroenteritis and colitis, unspecified: Secondary | ICD-10-CM | POA: Diagnosis present

## 2021-10-04 DIAGNOSIS — R0902 Hypoxemia: Secondary | ICD-10-CM | POA: Diagnosis not present

## 2021-10-04 DIAGNOSIS — L8990 Pressure ulcer of unspecified site, unspecified stage: Secondary | ICD-10-CM | POA: Insufficient documentation

## 2021-10-04 DIAGNOSIS — Z90710 Acquired absence of both cervix and uterus: Secondary | ICD-10-CM

## 2021-10-04 DIAGNOSIS — R4182 Altered mental status, unspecified: Secondary | ICD-10-CM | POA: Diagnosis present

## 2021-10-04 DIAGNOSIS — N390 Urinary tract infection, site not specified: Secondary | ICD-10-CM | POA: Diagnosis present

## 2021-10-04 DIAGNOSIS — I4891 Unspecified atrial fibrillation: Secondary | ICD-10-CM | POA: Diagnosis present

## 2021-10-04 DIAGNOSIS — R627 Adult failure to thrive: Secondary | ICD-10-CM | POA: Diagnosis present

## 2021-10-04 DIAGNOSIS — R809 Proteinuria, unspecified: Secondary | ICD-10-CM | POA: Diagnosis present

## 2021-10-04 DIAGNOSIS — K56609 Unspecified intestinal obstruction, unspecified as to partial versus complete obstruction: Secondary | ICD-10-CM | POA: Diagnosis present

## 2021-10-04 DIAGNOSIS — I248 Other forms of acute ischemic heart disease: Secondary | ICD-10-CM | POA: Diagnosis present

## 2021-10-04 DIAGNOSIS — R6521 Severe sepsis with septic shock: Secondary | ICD-10-CM | POA: Diagnosis present

## 2021-10-04 DIAGNOSIS — E785 Hyperlipidemia, unspecified: Secondary | ICD-10-CM | POA: Diagnosis present

## 2021-10-04 DIAGNOSIS — Z20822 Contact with and (suspected) exposure to covid-19: Secondary | ICD-10-CM | POA: Diagnosis present

## 2021-10-04 DIAGNOSIS — H409 Unspecified glaucoma: Secondary | ICD-10-CM | POA: Diagnosis present

## 2021-10-04 DIAGNOSIS — Z87891 Personal history of nicotine dependence: Secondary | ICD-10-CM

## 2021-10-04 DIAGNOSIS — J9621 Acute and chronic respiratory failure with hypoxia: Secondary | ICD-10-CM | POA: Diagnosis present

## 2021-10-04 DIAGNOSIS — R7989 Other specified abnormal findings of blood chemistry: Secondary | ICD-10-CM

## 2021-10-04 DIAGNOSIS — R319 Hematuria, unspecified: Secondary | ICD-10-CM | POA: Diagnosis present

## 2021-10-04 DIAGNOSIS — K219 Gastro-esophageal reflux disease without esophagitis: Secondary | ICD-10-CM | POA: Diagnosis present

## 2021-10-04 DIAGNOSIS — E875 Hyperkalemia: Secondary | ICD-10-CM | POA: Diagnosis present

## 2021-10-04 DIAGNOSIS — Z515 Encounter for palliative care: Secondary | ICD-10-CM

## 2021-10-04 LAB — BASIC METABOLIC PANEL
Anion gap: 12 (ref 5–15)
BUN: 61 mg/dL — ABNORMAL HIGH (ref 8–23)
CO2: 17 mmol/L — ABNORMAL LOW (ref 22–32)
Calcium: 8.1 mg/dL — ABNORMAL LOW (ref 8.9–10.3)
Chloride: 109 mmol/L (ref 98–111)
Creatinine, Ser: 2.55 mg/dL — ABNORMAL HIGH (ref 0.44–1.00)
GFR, Estimated: 18 mL/min — ABNORMAL LOW (ref >60)
Glucose, Bld: 174 mg/dL — ABNORMAL HIGH (ref 70–99)
Potassium: 5.2 mmol/L — ABNORMAL HIGH (ref 3.5–5.1)
Sodium: 138 mmol/L (ref 135–145)

## 2021-10-04 LAB — TROPONIN I (HIGH SENSITIVITY)
Troponin I (High Sensitivity): 232 ng/L (ref <18)
Troponin I (High Sensitivity): 332 ng/L (ref <18)

## 2021-10-04 LAB — CBC
HCT: 27.2 % — ABNORMAL LOW (ref 36.0–46.0)
Hemoglobin: 8.6 g/dL — ABNORMAL LOW (ref 12.0–15.0)
MCH: 23.6 pg — ABNORMAL LOW (ref 26.0–34.0)
MCHC: 31.6 g/dL (ref 30.0–36.0)
MCV: 74.7 fL — ABNORMAL LOW (ref 80.0–100.0)
Platelets: 362 10*3/uL (ref 150–400)
RBC: 3.64 MIL/uL — ABNORMAL LOW (ref 3.87–5.11)
RDW: 17.1 % — ABNORMAL HIGH (ref 11.5–15.5)
WBC: 16.1 10*3/uL — ABNORMAL HIGH (ref 4.0–10.5)
nRBC: 0.2 % (ref 0.0–0.2)

## 2021-10-04 LAB — LACTIC ACID, PLASMA
Lactic Acid, Venous: 2.6 mmol/L (ref 0.5–1.9)
Lactic Acid, Venous: 4.7 mmol/L (ref 0.5–1.9)

## 2021-10-04 LAB — SARS CORONAVIRUS 2 BY RT PCR: SARS Coronavirus 2 by RT PCR: NEGATIVE

## 2021-10-04 MED ORDER — VANCOMYCIN HCL IN DEXTROSE 1-5 GM/200ML-% IV SOLN
1000.0000 mg | Freq: Once | INTRAVENOUS | Status: AC
  Administered 2021-10-04: 1000 mg via INTRAVENOUS
  Filled 2021-10-04: qty 200

## 2021-10-04 MED ORDER — LACTATED RINGERS IV BOLUS
1000.0000 mL | Freq: Once | INTRAVENOUS | Status: AC
  Administered 2021-10-04: 1000 mL via INTRAVENOUS

## 2021-10-04 MED ORDER — SODIUM CHLORIDE 0.9 % IV SOLN
2.0000 g | Freq: Once | INTRAVENOUS | Status: AC
  Administered 2021-10-04: 2 g via INTRAVENOUS
  Filled 2021-10-04: qty 12.5

## 2021-10-04 MED ORDER — METRONIDAZOLE 500 MG/100ML IV SOLN
500.0000 mg | Freq: Once | INTRAVENOUS | Status: AC
  Administered 2021-10-04: 500 mg via INTRAVENOUS
  Filled 2021-10-04: qty 100

## 2021-10-04 NOTE — ED Triage Notes (Signed)
Pt to ED via EMS from [facility] called out breathing difficulties. Initial sats 60-70% and placed on 15L NRB sats up to 100%.  Pt hypotensive 70/40 and given 250cc NS with rebound pressure 83/50. Pt normally on 3L [place] at baseline, hx DBM and HTN.  Pt alert to voice and oriented to self and place, disoriented to year and repeats year 1934.  Pt denies pain or SOB.  Pt has MOST form but is full scope care.

## 2021-10-04 NOTE — ED Provider Notes (Signed)
[facility] Provider Note    Event Date/Time   First MD Initiated Contact with Patient 29-Oct-2021 1943     (approximate)   History   Respiratory Distress and Failure To Thrive   HPI {Remember to add pertinent medical, surgical, social, and/or OB history to HPI:1} Ebony Torres is a 88 y.o. female  ***       Physical Exam   Triage Vital Signs: ED Triage Vitals  Enc Vitals Group     BP October 29, 2021 1934 (!) 93/35     Pulse Rate 10/29/2021 1934 75     Resp 10/29/2021 1934 (!) 25     Temp 10/29/2021 1934 97.8 F (36.6 C)     Temp Source Oct 29, 2021 1934 Oral     SpO2 October 29, 2021 1934 99 %     Weight Oct 29, 2021 1930 145 lb 4.5 oz (65.9 kg)     Height Oct 29, 2021 1930 5\' 2"  (1.575 m)     Head Circumference --      [facility] --      Pain Score 29-Oct-2021 1930 0     Pain Loc --      Pain Edu? --      Excl. in [place]? --     Most recent vital signs: Vitals:   10-29-21 1934  BP: (!) 93/35  Pulse: 75  Resp: (!) 25  Temp: 97.8 F (36.6 C)  SpO2: 99%    {Only need to document appropriate and relevant physical exam:1} General: Awake, no distress. *** CV:  Good peripheral perfusion. *** Resp:  Normal effort. *** Abd:  No distention. *** Other:  ***   ED Results / Procedures / Treatments   Labs (all labs ordered are listed, but only abnormal results are displayed) Labs Reviewed  CULTURE, BLOOD (ROUTINE X 2)  CULTURE, BLOOD (ROUTINE X 2)  SARS CORONAVIRUS 2 BY RT PCR  CBC  BASIC METABOLIC PANEL  LACTIC ACID, PLASMA  LACTIC ACID, PLASMA  TROPONIN I (HIGH SENSITIVITY)     EKG  I, Nance Pear, attending physician, personally viewed and interpreted this EKG  EKG Time: 1223 Rate: 76 Rhythm: sinus rhythm Axis: left axis deviation Intervals: qtc 482 QRS: RBBB,LAFB ST changes: no st elevation Impression: abnormal ekg    RADIOLOGY I independently interpreted and visualized the CXR. My interpretation: Cardiomegaly Radiology interpretation:  ***    PROCEDURES:  Critical Care performed: {CriticalCareYesNo:19197::"Yes, see critical care procedure note(s)","No"}  Procedures   MEDICATIONS ORDERED IN ED: Medications - No data to display   IMPRESSION / MDM / [place] / ED COURSE  I reviewed the triage vital signs and the nursing notes.                              Differential diagnosis includes, but is not limited to, ***  Patient's presentation is most consistent with {EM COPA:27473}  {If the patient is on the monitor, remove the brackets and asterisks on the sentence below and remember to document it as a Procedure as well. Otherwise delete the sentence below:1} {**The patient is on the cardiac monitor to evaluate for evidence of arrhythmia and/or significant heart rate changes.**} {Remember to include, when applicable, any/all of the following data: independent review of imaging independent review of labs (comment specifically on pertinent positives and negatives) review of specific prior hospitalizations, PCP/specialist notes, etc. discuss meds given and prescribed document any discussion with consultants (including hospitalists) any clinical decision  tools you used and why (PECARN, NEXUS, etc.) did you consider admitting the patient? document social determinants of health affecting patient's care (homelessness, inability to follow up in a timely fashion, etc) document any pre-existing conditions increasing risk on current visit (e.g. diabetes and HTN increasing danger of high-risk chest pain/ACS) describes what meds you gave (especially parenteral) and why any other interventions?:1}     FINAL CLINICAL IMPRESSION(S) / ED DIAGNOSES   Final diagnoses:  None     Rx / DC Orders   ED Discharge Orders     None        Note:  This document was prepared using Dragon voice recognition software and may include unintentional dictation errors.

## 2021-10-04 NOTE — ED Provider Notes (Incomplete)
[facility] Provider Note    Event Date/Time   First MD Initiated Contact with Patient 10-29-2021 1943     (approximate)   History   Respiratory Distress and Failure To Thrive   HPI {Remember to add pertinent medical, surgical, social, and/or OB history to HPI:1} Ebony Torres is a 88 y.o. female who presents to the emergency department today from living facility because of concerns for abnormal vital signs and decreased responsiveness.  Son who is at bedside gives the history.  States that he visited her yesterday night.  At that time she was      Physical Exam   Triage Vital Signs: ED Triage Vitals  Enc Vitals Group     BP 10/29/21 1934 (!) 93/35     Pulse Rate October 29, 2021 1934 75     Resp Oct 29, 2021 1934 (!) 25     Temp October 29, 2021 1934 97.8 F (36.6 C)     Temp Source 10-29-2021 1934 Oral     SpO2 10-29-21 1934 99 %     Weight Oct 29, 2021 1930 145 lb 4.5 oz (65.9 kg)     Height 2021/10/29 1930 5\' 2"  (1.575 m)     Head Circumference --      [place] [facility] --      Pain Score 2021/10/29 1930 0     Pain Loc --      Pain Edu? --      Excl. in [place]? --     Most recent vital signs: Vitals:   10-29-21 1934  BP: (!) 93/35  Pulse: 75  Resp: (!) 25  Temp: 97.8 F (36.6 C)  SpO2: 99%    {Only need to document appropriate and relevant physical exam:1} General: Awake, no distress. *** CV:  Good peripheral perfusion. *** Resp:  Normal effort. *** Abd:  No distention. *** Other:  ***   ED Results / Procedures / Treatments   Labs (all labs ordered are listed, but only abnormal results are displayed) Labs Reviewed  CULTURE, BLOOD (ROUTINE X 2)  CULTURE, BLOOD (ROUTINE X 2)  SARS CORONAVIRUS 2 BY RT PCR  CBC  BASIC METABOLIC PANEL  LACTIC ACID, PLASMA  LACTIC ACID, PLASMA  TROPONIN I (HIGH SENSITIVITY)     EKG  I, Nance Pear, attending physician, personally viewed and interpreted this EKG  EKG Time: 1223 Rate: 76 Rhythm: sinus rhythm Axis:  left axis deviation Intervals: qtc 482 QRS: RBBB,LAFB ST changes: no st elevation Impression: abnormal ekg    RADIOLOGY I independently interpreted and visualized the CXR. My interpretation: Cardiomegaly Radiology interpretation: ***    PROCEDURES:  Critical Care performed: {CriticalCareYesNo:19197::"Yes, see critical care procedure note(s)","No"}  Procedures   MEDICATIONS ORDERED IN ED: Medications - No data to display   IMPRESSION / MDM / [place] / ED COURSE  I reviewed the triage vital signs and the nursing notes.                              Differential diagnosis includes, but is not limited to, ***  Patient's presentation is most consistent with {EM COPA:27473}  {If the patient is on the monitor, remove the brackets and asterisks on the sentence below and remember to document it as a Procedure as well. Otherwise delete the sentence below:1} {**The patient is on the cardiac monitor to evaluate for evidence of arrhythmia and/or significant heart rate changes.**} {Remember to include, when applicable, any/all of the  following data: independent review of imaging independent review of labs (comment specifically on pertinent positives and negatives) review of specific prior hospitalizations, PCP/specialist notes, etc. discuss meds given and prescribed document any discussion with consultants (including hospitalists) any clinical decision tools you used and why (PECARN, NEXUS, etc.) did you consider admitting the patient? document social determinants of health affecting patient's care (homelessness, inability to follow up in a timely fashion, etc) document any pre-existing conditions increasing risk on current visit (e.g. diabetes and HTN increasing danger of high-risk chest pain/ACS) describes what meds you gave (especially parenteral) and why any other interventions?:1}     FINAL CLINICAL IMPRESSION(S) / ED DIAGNOSES   Final diagnoses:  None      Rx / DC Orders   ED Discharge Orders     None        Note:  This document was prepared using Dragon voice recognition software and may include unintentional dictation errors.

## 2021-10-05 ENCOUNTER — Inpatient Hospital Stay: Payer: Medicare Other

## 2021-10-05 ENCOUNTER — Inpatient Hospital Stay (HOSPITAL_COMMUNITY)
Admit: 2021-10-05 | Discharge: 2021-10-05 | Disposition: A | Discharge status: Home / Self Care | Payer: Medicare Other | Attending: Nurse Practitioner | Admitting: Nurse Practitioner

## 2021-10-05 DIAGNOSIS — R0902 Hypoxemia: Secondary | ICD-10-CM | POA: Diagnosis present

## 2021-10-05 DIAGNOSIS — R319 Hematuria, unspecified: Secondary | ICD-10-CM | POA: Diagnosis present

## 2021-10-05 DIAGNOSIS — R4182 Altered mental status, unspecified: Secondary | ICD-10-CM | POA: Diagnosis present

## 2021-10-05 DIAGNOSIS — K567 Ileus, unspecified: Secondary | ICD-10-CM | POA: Diagnosis present

## 2021-10-05 DIAGNOSIS — A419 Sepsis, unspecified organism: Secondary | ICD-10-CM | POA: Diagnosis not present

## 2021-10-05 DIAGNOSIS — R627 Adult failure to thrive: Secondary | ICD-10-CM | POA: Diagnosis present

## 2021-10-05 DIAGNOSIS — R6521 Severe sepsis with septic shock: Secondary | ICD-10-CM | POA: Diagnosis present

## 2021-10-05 DIAGNOSIS — Z20822 Contact with and (suspected) exposure to covid-19: Secondary | ICD-10-CM | POA: Diagnosis present

## 2021-10-05 DIAGNOSIS — Z66 Do not resuscitate: Secondary | ICD-10-CM | POA: Diagnosis not present

## 2021-10-05 DIAGNOSIS — E875 Hyperkalemia: Secondary | ICD-10-CM | POA: Diagnosis present

## 2021-10-05 DIAGNOSIS — I119 Hypertensive heart disease without heart failure: Secondary | ICD-10-CM | POA: Diagnosis present

## 2021-10-05 DIAGNOSIS — Z9981 Dependence on supplemental oxygen: Secondary | ICD-10-CM | POA: Diagnosis not present

## 2021-10-05 DIAGNOSIS — I959 Hypotension, unspecified: Secondary | ICD-10-CM | POA: Diagnosis present

## 2021-10-05 DIAGNOSIS — I5031 Acute diastolic (congestive) heart failure: Secondary | ICD-10-CM | POA: Diagnosis not present

## 2021-10-05 DIAGNOSIS — E119 Type 2 diabetes mellitus without complications: Secondary | ICD-10-CM | POA: Diagnosis present

## 2021-10-05 DIAGNOSIS — K56609 Unspecified intestinal obstruction, unspecified as to partial versus complete obstruction: Secondary | ICD-10-CM | POA: Diagnosis present

## 2021-10-05 DIAGNOSIS — L8990 Pressure ulcer of unspecified site, unspecified stage: Secondary | ICD-10-CM | POA: Insufficient documentation

## 2021-10-05 DIAGNOSIS — Z515 Encounter for palliative care: Secondary | ICD-10-CM | POA: Diagnosis not present

## 2021-10-05 DIAGNOSIS — J9621 Acute and chronic respiratory failure with hypoxia: Secondary | ICD-10-CM | POA: Diagnosis present

## 2021-10-05 DIAGNOSIS — N390 Urinary tract infection, site not specified: Secondary | ICD-10-CM | POA: Diagnosis present

## 2021-10-05 DIAGNOSIS — D638 Anemia in other chronic diseases classified elsewhere: Secondary | ICD-10-CM | POA: Diagnosis present

## 2021-10-05 DIAGNOSIS — K219 Gastro-esophageal reflux disease without esophagitis: Secondary | ICD-10-CM | POA: Diagnosis present

## 2021-10-05 DIAGNOSIS — I4891 Unspecified atrial fibrillation: Secondary | ICD-10-CM | POA: Diagnosis present

## 2021-10-05 DIAGNOSIS — E785 Hyperlipidemia, unspecified: Secondary | ICD-10-CM | POA: Diagnosis present

## 2021-10-05 DIAGNOSIS — H409 Unspecified glaucoma: Secondary | ICD-10-CM | POA: Diagnosis present

## 2021-10-05 DIAGNOSIS — I248 Other forms of acute ischemic heart disease: Secondary | ICD-10-CM | POA: Diagnosis present

## 2021-10-05 DIAGNOSIS — N170 Acute kidney failure with tubular necrosis: Secondary | ICD-10-CM | POA: Diagnosis present

## 2021-10-05 DIAGNOSIS — A4151 Sepsis due to Escherichia coli [E. coli]: Secondary | ICD-10-CM | POA: Diagnosis present

## 2021-10-05 DIAGNOSIS — J189 Pneumonia, unspecified organism: Secondary | ICD-10-CM | POA: Diagnosis present

## 2021-10-05 LAB — BLOOD CULTURE ID PANEL (REFLEXED) - BCID2

## 2021-10-05 LAB — GLUCOSE, CAPILLARY
Glucose-Capillary: 126 mg/dL — ABNORMAL HIGH (ref 70–99)
Glucose-Capillary: 133 mg/dL — ABNORMAL HIGH (ref 70–99)
Glucose-Capillary: 112 mg/dL — ABNORMAL HIGH (ref 70–99)
Glucose-Capillary: 80 mg/dL (ref 70–99)
Glucose-Capillary: 77 mg/dL (ref 70–99)
Glucose-Capillary: 79 mg/dL (ref 70–99)

## 2021-10-05 LAB — GASTROINTESTINAL PANEL BY PCR, STOOL (REPLACES STOOL CULTURE)

## 2021-10-05 LAB — MRSA NEXT GEN BY PCR, NASAL: MRSA by PCR Next Gen: NOT DETECTED

## 2021-10-05 LAB — BLOOD GAS, ARTERIAL
Acid-base deficit: 9.2 mmol/L — ABNORMAL HIGH (ref 0.0–2.0)
Bicarbonate: 15.1 mmol/L — ABNORMAL LOW (ref 20.0–28.0)
O2 Content: 2 L/min
O2 Saturation: 100 %
Patient temperature: 37
pCO2 arterial: 28 mmHg — ABNORMAL LOW (ref 32–48)
pH, Arterial: 7.34 — ABNORMAL LOW (ref 7.35–7.45)
pO2, Arterial: 95 mmHg (ref 83–108)

## 2021-10-05 LAB — URINALYSIS, COMPLETE (UACMP) WITH MICROSCOPIC
Glucose, UA: NEGATIVE mg/dL
Nitrite: NEGATIVE
Protein, ur: 100 mg/dL — AB
RBC / HPF: >50 RBC/hpf — ABNORMAL HIGH (ref 0–5)
Specific Gravity, Urine: 1.02 (ref 1.005–1.030)
pH: 5 (ref 5.0–8.0)

## 2021-10-05 LAB — C DIFFICILE QUICK SCREEN W PCR REFLEX
C Diff antigen: NEGATIVE
C Diff interpretation: NOT DETECTED
C Diff toxin: NEGATIVE

## 2021-10-05 LAB — BASIC METABOLIC PANEL
Anion gap: 12 (ref 5–15)
Anion gap: 12 (ref 5–15)
BUN: 61 mg/dL — ABNORMAL HIGH (ref 8–23)
BUN: 64 mg/dL — ABNORMAL HIGH (ref 8–23)
CO2: 17 mmol/L — ABNORMAL LOW (ref 22–32)
CO2: 20 mmol/L — ABNORMAL LOW (ref 22–32)
Calcium: 7.5 mg/dL — ABNORMAL LOW (ref 8.9–10.3)
Calcium: 7.3 mg/dL — ABNORMAL LOW (ref 8.9–10.3)
Chloride: 102 mmol/L (ref 98–111)
Chloride: 102 mmol/L (ref 98–111)
Creatinine, Ser: 2.61 mg/dL — ABNORMAL HIGH (ref 0.44–1.00)
Creatinine, Ser: 2.74 mg/dL — ABNORMAL HIGH (ref 0.44–1.00)
GFR, Estimated: 17 mL/min — ABNORMAL LOW (ref >60)
GFR, Estimated: 16 mL/min — ABNORMAL LOW (ref >60)
Glucose, Bld: 110 mg/dL — ABNORMAL HIGH (ref 70–99)
Glucose, Bld: 99 mg/dL (ref 70–99)
Potassium: 5.5 mmol/L — ABNORMAL HIGH (ref 3.5–5.1)
Potassium: 5.1 mmol/L (ref 3.5–5.1)
Sodium: 131 mmol/L — ABNORMAL LOW (ref 135–145)
Sodium: 134 mmol/L — ABNORMAL LOW (ref 135–145)

## 2021-10-05 LAB — COMPREHENSIVE METABOLIC PANEL
ALT: 12 U/L (ref 0–44)
AST: 39 U/L (ref 15–41)
Albumin: 2.5 g/dL — ABNORMAL LOW (ref 3.5–5.0)
Alkaline Phosphatase: 78 U/L (ref 38–126)
Anion gap: 11 (ref 5–15)
BUN: 60 mg/dL — ABNORMAL HIGH (ref 8–23)
CO2: 17 mmol/L — ABNORMAL LOW (ref 22–32)
Calcium: 7.9 mg/dL — ABNORMAL LOW (ref 8.9–10.3)
Chloride: 108 mmol/L (ref 98–111)
Creatinine, Ser: 2.53 mg/dL — ABNORMAL HIGH (ref 0.44–1.00)
GFR, Estimated: 18 mL/min — ABNORMAL LOW (ref >60)
Glucose, Bld: 172 mg/dL — ABNORMAL HIGH (ref 70–99)
Potassium: 5 mmol/L (ref 3.5–5.1)
Sodium: 136 mmol/L (ref 135–145)
Total Bilirubin: 1 mg/dL (ref 0.3–1.2)
Total Protein: 5.8 g/dL — ABNORMAL LOW (ref 6.5–8.1)

## 2021-10-05 LAB — ECHOCARDIOGRAM COMPLETE
AR max vel: 1.56 cm2
AV Area VTI: 1.83 cm2
AV Area mean vel: 1.52 cm2
AV Mean grad: 4 mmHg
AV Peak grad: 7 mmHg
Ao pk vel: 1.32 m/s
Area-P 1/2: 9.6 cm2
Height: 62 in
S' Lateral: 2.3 cm
Weight: 2239.87 oz

## 2021-10-05 LAB — CBC
HCT: 25.7 % — ABNORMAL LOW (ref 36.0–46.0)
Hemoglobin: 8.5 g/dL — ABNORMAL LOW (ref 12.0–15.0)
MCH: 23.7 pg — ABNORMAL LOW (ref 26.0–34.0)
MCHC: 33.1 g/dL (ref 30.0–36.0)
MCV: 71.8 fL — ABNORMAL LOW (ref 80.0–100.0)
Platelets: 349 10*3/uL (ref 150–400)
RBC: 3.58 MIL/uL — ABNORMAL LOW (ref 3.87–5.11)
RDW: 16.5 % — ABNORMAL HIGH (ref 11.5–15.5)
WBC: 6.9 10*3/uL (ref 4.0–10.5)
nRBC: 0.6 % — ABNORMAL HIGH (ref 0.0–0.2)

## 2021-10-05 LAB — IRON AND TIBC
Iron: 11 ug/dL — ABNORMAL LOW (ref 28–170)
Saturation Ratios: 6 % — ABNORMAL LOW (ref 10.4–31.8)
TIBC: 186 ug/dL — ABNORMAL LOW (ref 250–450)
UIBC: 175 ug/dL

## 2021-10-05 LAB — HEPARIN LEVEL (UNFRACTIONATED): Heparin Unfractionated: >1.1 IU/mL — ABNORMAL HIGH (ref 0.30–0.70)

## 2021-10-05 LAB — STREP PNEUMONIAE URINARY ANTIGEN: Strep Pneumo Urinary Antigen: NEGATIVE

## 2021-10-05 LAB — HEMOGLOBIN A1C
Hgb A1c MFr Bld: 6.3 % — ABNORMAL HIGH (ref 4.8–5.6)
Mean Plasma Glucose: 134.11 mg/dL

## 2021-10-05 LAB — D-DIMER, QUANTITATIVE: D-Dimer, Quant: 2.81 ug/mL-FEU — ABNORMAL HIGH (ref 0.00–0.50)

## 2021-10-05 LAB — PROTIME-INR
INR: 3.3 — ABNORMAL HIGH (ref 0.8–1.2)
Prothrombin Time: 33 seconds — ABNORMAL HIGH (ref 11.4–15.2)

## 2021-10-05 LAB — RETICULOCYTES
Immature Retic Fract: 23.7 % — ABNORMAL HIGH (ref 2.3–15.9)
RBC.: 3.59 MIL/uL — ABNORMAL LOW (ref 3.87–5.11)
Retic Count, Absolute: 56.4 10*3/uL (ref 19.0–186.0)
Retic Ct Pct: 1.6 % (ref 0.4–3.1)

## 2021-10-05 LAB — BRAIN NATRIURETIC PEPTIDE: B Natriuretic Peptide: 361.9 pg/mL — ABNORMAL HIGH (ref 0.0–100.0)

## 2021-10-05 LAB — LACTIC ACID, PLASMA: Lactic Acid, Venous: 1.6 mmol/L (ref 0.5–1.9)

## 2021-10-05 LAB — PHOSPHORUS: Phosphorus: 5.2 mg/dL — ABNORMAL HIGH (ref 2.5–4.6)

## 2021-10-05 LAB — PROCALCITONIN: Procalcitonin: 21.39 ng/mL

## 2021-10-05 LAB — VITAMIN B12: Vitamin B-12: 457 pg/mL (ref 180–914)

## 2021-10-05 LAB — TROPONIN I (HIGH SENSITIVITY): Troponin I (High Sensitivity): 381 ng/L (ref <18)

## 2021-10-05 LAB — FOLATE: Folate: 15 ng/mL (ref >5.9)

## 2021-10-05 LAB — FERRITIN: Ferritin: 157 ng/mL (ref 11–307)

## 2021-10-05 LAB — MAGNESIUM: Magnesium: 2.2 mg/dL (ref 1.7–2.4)

## 2021-10-05 LAB — APTT: aPTT: >200 seconds (ref 24–36)

## 2021-10-05 MED ORDER — DEXTROSE 50 % IV SOLN
1.0000 | Freq: Once | INTRAVENOUS | Status: AC
  Administered 2021-10-05: 50 mL via INTRAVENOUS
  Filled 2021-10-05: qty 50

## 2021-10-05 MED ORDER — SODIUM CHLORIDE 0.9 % IV SOLN: 250.0000 mL | INTRAVENOUS | Status: DC

## 2021-10-05 MED ORDER — CHLORHEXIDINE GLUCONATE CLOTH 2 % EX PADS
6.0000 | MEDICATED_PAD | Freq: Every day | CUTANEOUS | Status: DC
  Administered 2021-10-05 – 2021-10-06 (×2): 6 via TOPICAL

## 2021-10-05 MED ORDER — SODIUM BICARBONATE 8.4 % IV SOLN
100.0000 meq | Freq: Once | INTRAVENOUS | Status: AC
  Administered 2021-10-05: 100 meq via INTRAVENOUS
  Filled 2021-10-05: qty 100

## 2021-10-05 MED ORDER — HEPARIN (PORCINE) 25000 UT/250ML-% IV SOLN: 550.0000 [IU]/h | INTRAVENOUS | Status: DC

## 2021-10-05 MED ORDER — ORAL CARE MOUTH RINSE: 15.0000 mL | OROMUCOSAL | Status: DC | PRN

## 2021-10-05 MED ORDER — PIPERACILLIN-TAZOBACTAM IN DEX 2-0.25 GM/50ML IV SOLN
2.2500 g | Freq: Three times a day (TID) | INTRAVENOUS | Status: DC
  Administered 2021-10-05: 2.25 g via INTRAVENOUS
  Filled 2021-10-05 (×2): qty 50

## 2021-10-05 MED ORDER — HEPARIN SODIUM (PORCINE) 5000 UNIT/ML IJ SOLN
5000.0000 [IU] | Freq: Three times a day (TID) | INTRAMUSCULAR | Status: DC
  Administered 2021-10-05: 5000 [IU] via SUBCUTANEOUS
  Filled 2021-10-05: qty 1

## 2021-10-05 MED ORDER — SODIUM CHLORIDE 0.9 % IV SOLN
2.0000 g | INTRAVENOUS | Status: DC
  Administered 2021-10-06: 2 g via INTRAVENOUS
  Filled 2021-10-05: qty 20

## 2021-10-05 MED ORDER — NOREPINEPHRINE 16 MG/250ML-% IV SOLN
0.0000 ug/min | INTRAVENOUS | Status: DC
  Administered 2021-10-05 – 2021-10-06 (×2): 16 ug/min via INTRAVENOUS
  Filled 2021-10-05 (×2): qty 250

## 2021-10-05 MED ORDER — LACTATED RINGERS IV BOLUS
1000.0000 mL | Freq: Once | INTRAVENOUS | Status: AC
  Administered 2021-10-05: 1000 mL via INTRAVENOUS

## 2021-10-05 MED ORDER — INSULIN ASPART 100 UNIT/ML IJ SOLN
0.0000 [IU] | Freq: Three times a day (TID) | INTRAMUSCULAR | Status: DC
  Administered 2021-10-05: 1 [IU] via SUBCUTANEOUS
  Filled 2021-10-05: qty 1

## 2021-10-05 MED ORDER — INSULIN ASPART 100 UNIT/ML IV SOLN
10.0000 [IU] | Freq: Once | INTRAVENOUS | Status: AC
  Administered 2021-10-05: 10 [IU] via INTRAVENOUS
  Filled 2021-10-05: qty 0.1

## 2021-10-05 MED ORDER — NOREPINEPHRINE 4 MG/250ML-% IV SOLN
2.0000 ug/min | INTRAVENOUS | Status: DC
  Administered 2021-10-05: 2 ug/min via INTRAVENOUS
  Administered 2021-10-05: 10 ug/min via INTRAVENOUS
  Filled 2021-10-05 (×2): qty 250

## 2021-10-05 MED ORDER — DOCUSATE SODIUM 50 MG/5ML PO LIQD: 100.0000 mg | Freq: Two times a day (BID) | ORAL | Status: DC | PRN

## 2021-10-05 MED ORDER — INSULIN ASPART 100 UNIT/ML IJ SOLN: 0.0000 [IU] | Freq: Every day | INTRAMUSCULAR | Status: DC

## 2021-10-05 MED ORDER — MORPHINE SULFATE (PF) 2 MG/ML IV SOLN
1.0000 mg | INTRAVENOUS | Status: DC | PRN
  Administered 2021-10-05 – 2021-10-06 (×5): 1 mg via INTRAVENOUS
  Filled 2021-10-05 (×5): qty 1

## 2021-10-05 MED ORDER — HEPARIN (PORCINE) 25000 UT/250ML-% IV SOLN
800.0000 [IU]/h | INTRAVENOUS | Status: DC
  Administered 2021-10-05: 800 [IU]/h via INTRAVENOUS
  Filled 2021-10-05: qty 250

## 2021-10-05 MED ORDER — IOHEXOL 9 MG/ML PO SOLN
500.0000 mL | ORAL | Status: AC
  Administered 2021-10-05 (×2): 500 mL via ORAL

## 2021-10-05 MED ORDER — POLYETHYLENE GLYCOL 3350 17 G PO PACK: 17.0000 g | PACK | Freq: Every day | ORAL | Status: DC | PRN

## 2021-10-05 MED ORDER — BISACODYL 10 MG RE SUPP
10.0000 mg | Freq: Once | RECTAL | Status: AC
  Administered 2021-10-05: 10 mg via RECTAL
  Filled 2021-10-05: qty 1

## 2021-10-05 NOTE — Consult Note (Signed)
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 Patient is now admitted with septic shock secondary to pneumonia. Pharmacy consulted to assist with electrolyte monitoring and replacement as indicated.  Goal of Therapy:  Electrolytes within normal limits  Plan:  --No electrolyte replacement indicated at this time --Follow-up electrolytes with AM labs tomorrow  Ebony Torres 2021-11-02 8:47 AM

## 2021-10-05 NOTE — Progress Notes (Signed)
eLink Physician-Brief Progress Note Patient Name: Ebony Torres DOB: 04-04-1930 MRN: [id number]   Date of Service  11/01/2021  HPI/Events of Note  Patient admitted with altered mental status, presumed to be secondary to pneumonia, and evolving sepsis , patient also presented with hypoxemic respiratory failure.  eICU Interventions  New Patient Evaluation.        [name] 2021/11/01, 3:27 AM

## 2021-10-05 NOTE — Progress Notes (Signed)
PHARMACY - PHYSICIAN COMMUNICATION CRITICAL VALUE ALERT - BLOOD CULTURE IDENTIFICATION (BCID)  Results for orders placed or performed during the hospital encounter of 09/27/2021  SARS Coronavirus 2 by RT PCR (hospital order, performed in [facility] hospital lab) *cepheid single result test* Anterior Nasal Swab     Status: None   Collection Time: 09/29/2021  8:11 PM   Specimen: Anterior Nasal Swab  Result Value Ref Range Status   SARS Coronavirus 2 by RT PCR NEGATIVE NEGATIVE Final    Comment: (NOTE) SARS-CoV-2 target nucleic acids are NOT DETECTED.  The SARS-CoV-2 RNA is generally detectable in upper and lower respiratory specimens during the acute phase of infection. The lowest concentration of SARS-CoV-2 viral copies this assay can detect is 250 copies / mL. A negative result does not preclude SARS-CoV-2 infection and should not be used as the sole basis for treatment or other patient management decisions.  A negative result may occur with improper specimen collection / handling, submission of specimen other than nasopharyngeal swab, presence of viral mutation(s) within the areas targeted by this assay, and inadequate number of viral copies (<250 copies / mL). A negative result must be combined with clinical observations, patient history, and epidemiological information.  Fact Sheet for Patients:   RoadLapTop.co.za  Fact Sheet for Healthcare Providers: http://kim-miller.com/  This test is not yet approved or  cleared by the [place] FDA and has been authorized for detection and/or diagnosis of SARS-CoV-2 by FDA under an Emergency Use Authorization (EUA).  This EUA will remain in effect (meaning this test can be used) for the duration of the COVID-19 declaration under Section 564(b)(1) of the Act, 21 U.S.C. section 360bbb-3(b)(1), unless the authorization is terminated or revoked sooner.  Performed at [facility], [address] [place], [place] [address]   Blood culture (routine x 2)     Status: None (Preliminary result)   Collection Time: 09/24/2021  8:37 PM   Specimen: BLOOD  Result Value Ref Range Status   Specimen Description BLOOD BLOOD RIGHT ARM  Final   Special Requests   Final    BOTTLES DRAWN AEROBIC AND ANAEROBIC Blood Culture adequate volume   Culture   Final    NO GROWTH < 24 HOURS Performed at [facility], [address], [place], [place] [address]    Report Status PENDING  Incomplete  Blood culture (routine x 2)     Status: None (Preliminary result)   Collection Time: 10/06/2021  8:37 PM   Specimen: BLOOD  Result Value Ref Range Status   Specimen Description BLOOD BLOOD RIGHT ARM  Final   Special Requests   Final    BOTTLES DRAWN AEROBIC AND ANAEROBIC Blood Culture adequate volume   Culture  Setup Time   Final    Organism ID to follow GRAM NEGATIVE RODS ANAEROBIC BOTTLE ONLY CRITICAL RESULT CALLED TO, READ BACK BY AND VERIFIED WITH: Waverly Chavarria BELEUE @2236  ON 2021/10/26 SKL Performed at [facility], [address] [place], [place]    Culture GRAM NEGATIVE RODS  Final   Report Status PENDING  Incomplete  Blood Culture ID Panel (Reflexed)     Status: Abnormal   Collection Time: 09/25/2021  8:37 PM  Result Value Ref Range Status   Enterococcus faecalis NOT DETECTED NOT DETECTED Final   Enterococcus Faecium NOT DETECTED NOT DETECTED Final   Listeria monocytogenes NOT DETECTED NOT DETECTED Final   Staphylococcus species NOT DETECTED NOT DETECTED Final   Staphylococcus aureus (BCID) NOT  DETECTED NOT DETECTED Final   Staphylococcus epidermidis NOT DETECTED NOT DETECTED Final   Staphylococcus lugdunensis NOT DETECTED NOT DETECTED Final   Streptococcus species NOT DETECTED NOT DETECTED Final   Streptococcus agalactiae NOT DETECTED NOT DETECTED Final   Streptococcus pneumoniae NOT DETECTED NOT DETECTED Final   Streptococcus pyogenes NOT DETECTED NOT DETECTED  Final   A.calcoaceticus-baumannii NOT DETECTED NOT DETECTED Final   Bacteroides fragilis NOT DETECTED NOT DETECTED Final   Enterobacterales DETECTED (A) NOT DETECTED Final    Comment: Enterobacterales represent a large order of gram negative bacteria, not a single organism. CRITICAL RESULT CALLED TO, READ BACK BY AND VERIFIED WITH: Elihue Ebert BELEUE @2236  ON 10/16/21 SKL    Enterobacter cloacae complex NOT DETECTED NOT DETECTED Final   Escherichia coli DETECTED (A) NOT DETECTED Final    Comment: CRITICAL RESULT CALLED TO, READ BACK BY AND VERIFIED WITH: Djuan Talton BELEUE @2236  ON 10/16/21 SKL    Klebsiella aerogenes NOT DETECTED NOT DETECTED Final   Klebsiella oxytoca NOT DETECTED NOT DETECTED Final   Klebsiella pneumoniae NOT DETECTED NOT DETECTED Final   Proteus species NOT DETECTED NOT DETECTED Final   Salmonella species NOT DETECTED NOT DETECTED Final   Serratia marcescens NOT DETECTED NOT DETECTED Final   Haemophilus influenzae NOT DETECTED NOT DETECTED Final   Neisseria meningitidis NOT DETECTED NOT DETECTED Final   Pseudomonas aeruginosa NOT DETECTED NOT DETECTED Final   Stenotrophomonas maltophilia NOT DETECTED NOT DETECTED Final   Candida albicans NOT DETECTED NOT DETECTED Final   Candida auris NOT DETECTED NOT DETECTED Final   Candida glabrata NOT DETECTED NOT DETECTED Final   Candida krusei NOT DETECTED NOT DETECTED Final   Candida parapsilosis NOT DETECTED NOT DETECTED Final   Candida tropicalis NOT DETECTED NOT DETECTED Final   Cryptococcus neoformans/gattii NOT DETECTED NOT DETECTED Final   CTX-M ESBL NOT DETECTED NOT DETECTED Final   Carbapenem resistance IMP NOT DETECTED NOT DETECTED Final   Carbapenem resistance KPC NOT DETECTED NOT DETECTED Final   Carbapenem resistance NDM NOT DETECTED NOT DETECTED Final   Carbapenem resist OXA 48 LIKE NOT DETECTED NOT DETECTED Final   Carbapenem resistance VIM NOT DETECTED NOT DETECTED Final    Comment: Performed at [facility], [address] [place], [place] [address]  MRSA Next Gen by PCR, Nasal     Status: None   Collection Time: 16-Oct-2021  1:59 AM   Specimen: Nasal Mucosa; Nasal Swab  Result Value Ref Range Status   MRSA by PCR Next Gen NOT DETECTED NOT DETECTED Final    Comment: (NOTE) The GeneXpert MRSA Assay (FDA approved for NASAL specimens only), is one component of a comprehensive MRSA colonization surveillance program. It is not intended to diagnose MRSA infection nor to guide or monitor treatment for MRSA infections. Test performance is not FDA approved in patients less than 71 years old. Performed at [facility], [place], [place] [address]   C Difficile Quick Screen w PCR reflex     Status: None   Collection Time: 10/16/2021  8:20 AM   Specimen: STOOL  Result Value Ref Range Status   C Diff antigen NEGATIVE NEGATIVE Final   C Diff toxin NEGATIVE NEGATIVE Final   C Diff interpretation No C. difficile detected.  Final    Comment: Performed at [facility], [address] [place], [place] [address]  Gastrointestinal Panel by PCR , Stool     Status: None   Collection Time: 10-16-21  8:20 AM  Specimen: Stool  Result Value Ref Range Status   Campylobacter species NOT DETECTED NOT DETECTED Final   Plesimonas shigelloides NOT DETECTED NOT DETECTED Final   Salmonella species NOT DETECTED NOT DETECTED Final   Yersinia enterocolitica NOT DETECTED NOT DETECTED Final   Vibrio species NOT DETECTED NOT DETECTED Final   Vibrio cholerae NOT DETECTED NOT DETECTED Final   Enteroaggregative E coli (EAEC) NOT DETECTED NOT DETECTED Final   Enteropathogenic E coli (EPEC) NOT DETECTED NOT DETECTED Final   Enterotoxigenic E coli (ETEC) NOT DETECTED NOT DETECTED Final   Shiga like toxin producing E coli (STEC) NOT DETECTED NOT DETECTED Final   Shigella/Enteroinvasive E coli (EIEC) NOT DETECTED NOT DETECTED Final   Cryptosporidium NOT DETECTED NOT DETECTED Final    Cyclospora cayetanensis NOT DETECTED NOT DETECTED Final   Entamoeba histolytica NOT DETECTED NOT DETECTED Final   Giardia lamblia NOT DETECTED NOT DETECTED Final   Adenovirus F40/41 NOT DETECTED NOT DETECTED Final   Astrovirus NOT DETECTED NOT DETECTED Final   Norovirus GI/GII NOT DETECTED NOT DETECTED Final   Rotavirus A NOT DETECTED NOT DETECTED Final   Sapovirus (I, II, IV, and V) NOT DETECTED NOT DETECTED Final    Comment: Performed at [facility], [address] [place], [place] [address]   BCID results:  1 (anaerobic) of 4 bottles w/ E coli, no resistance. Pt currently on Zosyn for intra-abdominal infection.  Name of provider contacted: Reyes Ivan, NP   Changes to prescribed antibiotics required: Transition pt to Ceftriaxone 2 gm q24h.  Otelia Sergeant, PharmD, [facility] 10/14/2021 11:28 PM

## 2021-10-05 NOTE — ED Notes (Addendum)
Intensivist at bedside at this time.

## 2021-10-05 NOTE — Procedures (Signed)
Central Venous Catheter Insertion Procedure Note  MODELLE VOLLMER  [id number]  09/04/1930  Date:09/21/2021  Time:6:09 AM   Provider Performing:Chantea Surace A Adjoa Althouse   Procedure: Insertion of Non-tunneled Central Venous Catheter(36556) with US guidance (55732)   Indication(s) Medication administration and Difficult access  Consent Unable to obtain consent due to emergent nature of procedure.  Anesthesia Topical only with 1% lidocaine   Timeout Verified patient identification, verified procedure, site/side was marked, verified correct patient position, special equipment/implants available, medications/allergies/relevant history reviewed, required imaging and test results available.  Sterile Technique Maximal sterile technique including full sterile barrier drape, hand hygiene, sterile gown, sterile gloves, mask, hair covering, sterile ultrasound probe cover (if used).  Procedure Description Area of catheter insertion was cleaned with chlorhexidine and draped in sterile fashion.  With real-time ultrasound guidance a central venous catheter was placed into the right internal jugular vein. Nonpulsatile blood flow and easy flushing noted in all ports.  The catheter was sutured in place and sterile dressing applied.  Complications/Tolerance None; patient tolerated the procedure well. Chest X-ray is ordered to verify placement for internal jugular or subclavian cannulation.   Chest x-ray is not ordered for femoral cannulation.  EBL Minimal  Specimen(s) None   Rufina Falco, DNP, CCRN, FNP-C, AGACNP-BC Acute Care & Family Nurse Practitioner  [facility]  See Amion for personal pager PCCM on call pager [phone] until 7 am

## 2021-10-05 NOTE — Progress Notes (Signed)
Verbal order for 1 liter LR bolus per Dr. Mortimer Fries. Admin at 0935.

## 2021-10-05 NOTE — H&P (Signed)
NAME:  Ebony Torres, MRN:  [id number], DOB:  1930/04/20, LOS: 0 ADMISSION DATE:  10/13/2021, CONSULTATION DATE: 10/09/2021 REFERRING MD:  Nance Pear CHIEF COMPLAINT:  Shortness of Breath   HPI  88 y.o with significant PMH  of Anemia of Chronic disease, HTN, HLD, T2DM, Vit B12 deficiency, chronic DVT on chronic anticoagulation with Eliquis who presented to the ED from [facility] with chief complaints of SOB.   Per ED reports, EMS was called out due to breathing difficulties. Initial sats  were between 60-70% and placed on 15L NRB sats up to 100%. Pt normally on 3L [place] at baseline  Pt was also hypotensive  with BP readings 70/40 and given 250cc NS with rebound pressure 83/50. Per patient's son, patient also has been having diarrhea for the past 5-6 months.  ED Course: In the emergency department, vital signs showed blood pressure of 93/35 dropped to 65/50 mm Hg, HR of 76 beats per minute, a respiratory rate of 25 breaths per minute, and a temperature of 97.17F (36.6C).  SATS 99% on 5L.  Pertinent Labs/Diagnostics Findings: Chemistry:Na+/ K+:138/5.2  Glucose:174  BUN/Cr.: 61/2.55  AST/ALT: CBC: WBC:16.1 Hgb/Hct: 8.6/27.2 Other Lab findings:   PCT: pending, Lactic acid: 2.6 ~4.7 COVID PCR: Negative, Troponin:232 ~332  Imaging:  CXR> Left lower lobe consolidation which may reflect atelectasis or Pneumonia. CTH> No acute intracranial abnormality. 2. Small right frontal meningioma measuring 11 x 6 x 12 mm. No mass effect  Patient given 30 cc/kg of fluids and started on broad-spectrum antibiotics Vanco cefepime and Flagyl for sepsis with septic shock. Patient remained hypotensive despite IVF boluses therefore was started on Levophed. PCCM consulted.  Past Medical History  Anemia of Chronic disease, HTN, HLD, T2DM, Vit B12 deficiency, chronic DVT on chronic anticoagulation with [place] [facility] Events   9/21: Admitted with sepsis secondary to suspected pneumonia  Consults:   None  Procedures:  None  Significant Diagnostic Tests:  9/20: Chest Xray>1. Left lower lobe consolidation which may reflect atelectasis or pneumonia. 9/20: Noncontrast CT head>1. No acute intracranial abnormality. 2. Small right frontal meningioma measuring 11 x 6 x 12 mm. No mass effect. This is likely incidental and of doubtful clinical significance, however there are no prior exams available to assess for stability. As clinically indicated, MRI could be considered for characterization. 3. Mild chronic small vessel ischemia  Micro Data:  9/20: SARS-CoV-2 PCR> negative 9/20: Influenza PCR> negative 9/20: Blood culture x2> 9/20: Urine Culture> 9/20: MRSA PCR>>  9/20: Strep pneumo urinary antigen> 9/20: Legionella urinary antigen>  Antimicrobials:  Vancomycin 9/20> Cefepime 9/20>  OBJECTIVE  Blood pressure (!) 105/53, pulse 90, temperature 97.8 F (36.6 C), temperature source Oral, resp. rate (!) 26, height 5\' 2"  (1.575 m), weight 65.9 kg, SpO2 99 %.        Intake/Output Summary (Last 24 hours) at 10/02/2021 0134 Last data filed at 09/16/2021 2345 Gross per 24 hour  Intake 1000 ml  Output --  Net 1000 ml   Filed Weights   10/14/2021 1930  Weight: 65.9 kg    Physical Examination  GENERAL: 88 year-old critically ill patient lying in the bed with no acute distress.  EYES: Pupils equal, round, reactive to light and accommodation. No scleral icterus. Extraocular muscles intact.  HEENT: Head atraumatic, normocephalic. Oropharynx and nasopharynx clear.  NECK:  Supple, no jugular venous distention. No thyroid enlargement, no tenderness.  LUNGS: Decreased breath sounds bilaterally, mild to moderate wheezing, rales, and diffuse crackles. Mild use  of accessory muscles of respiration.  CARDIOVASCULAR: S1, S2 normal. No murmurs, rubs, or gallops.  ABDOMEN: Soft, nontender, nondistended. Bowel sounds present. No organomegaly or mass.  EXTREMITIES: Bilateral lower extremities +2  pitting edema, No cyanosis, or clubbing.  NEUROLOGIC: Cranial nerves II through XII are intact.  Muscle strength 2/5 in bilateral upper extremities. Bilateral contractures R>L. Bilateral foot droop. Sensation intact. Gait not checked.  PSYCHIATRIC: The patient is alert and oriented x 1.  SKIN: Sacral ulcer present on admission  Labs/imaging that I havepersonally reviewed  (right click and "Reselect all SmartList Selections" daily)     Labs   CBC: Recent Labs  Lab 10/14/2021 1939  WBC 16.1*  HGB 8.6*  HCT 27.2*  MCV 74.7*  PLT 362    Basic Metabolic Panel: Recent Labs  Lab 10/10/2021 2305  NA 138  K 5.2*  CL 109  CO2 17*  GLUCOSE 174*  BUN 61*  CREATININE 2.55*  CALCIUM 8.1*   GFR: Estimated Creatinine Clearance: 13.3 mL/min (A) (by C-G formula based on SCr of 2.55 mg/dL (H)). Recent Labs  Lab 10/03/2021 1939 10/11/2021 2011 10/08/2021 2216  WBC 16.1*  --   --   LATICACIDVEN  --  2.6* 4.7*    Liver Function Tests: No results for input(s): "AST", "ALT", "ALKPHOS", "BILITOT", "PROT", "ALBUMIN" in the last 168 hours. No results for input(s): "LIPASE", "AMYLASE" in the last 168 hours. No results for input(s): "AMMONIA" in the last 168 hours.  ABG No results found for: "PHART", "PCO2ART", "PO2ART", "HCO3", "TCO2", "ACIDBASEDEF", "O2SAT"   Coagulation Profile: No results for input(s): "INR", "PROTIME" in the last 168 hours.  Cardiac Enzymes: No results for input(s): "CKTOTAL", "CKMB", "CKMBINDEX", "TROPONINI" in the last 168 hours.  HbA1C: Hgb A1c MFr Bld  Date/Time Value Ref Range Status  06/04/2020 06:11 AM 6.1 (H) 4.8 - 5.6 % Final    Comment:    (NOTE) Pre diabetes:          5.7%-6.4%  Diabetes:              >6.4%  Glycemic control for   <7.0% adults with diabetes     CBG: No results for input(s): "GLUCAP" in the last 168 hours.  Review of Systems:   UNABLE TO OBTAIN PATIENT NON VERBAL  Past Medical History  She,  has a past medical history of  Atrial fibrillation (HCC), Diabetes (HCC), GERD (gastroesophageal reflux disease), Glaucoma, and Hypertension.   Surgical History    Past Surgical History:  Procedure Laterality Date   ABDOMINAL HYSTERECTOMY     HEMORRHOID SURGERY       Social History   reports that she has quit smoking. Her smoking use included cigarettes. She has never used smokeless tobacco. She reports that she does not drink alcohol.   Family History   Her family history is negative for Breast cancer.   Allergies No Known Allergies   Home Medications  Prior to Admission medications   Medication Sig Start Date End Date Taking? Authorizing Provider  acetaminophen (TYLENOL) 325 MG tablet Take 650 mg by mouth every 6 (six) hours as needed.   Yes [provider]  acidophilus (RISAQUAD) CAPS capsule Take 1 capsule by mouth daily.   Yes [provider]  apixaban (ELIQUIS) 2.5 MG TABS tablet Take 1 tablet (2.5 mg total) by mouth 2 (two) times daily. 07/26/20  Yes Jene Every, MD  carbidopa-levodopa (SINEMET IR) 25-100 MG tablet Take 1 tablet by mouth in the morning and at bedtime.  09/21/21  Yes [provider]  famotidine (PEPCID) 10 MG tablet Take 10 mg by mouth at bedtime.   Yes [provider]  furosemide (LASIX) 20 MG tablet Take 1 tablet by mouth every other day. 09/29/21  Yes [provider]  loperamide (IMODIUM) 2 MG capsule Take 1 capsule (2 mg total) by mouth 2 (two) times daily as needed for diarrhea or loose stools. 06/06/20  Yes Dahal, Melina Schools, MD  loratadine (CLARITIN) 10 MG tablet Take 10 mg by mouth daily.   Yes [provider]  mirtazapine (REMERON) 7.5 MG tablet Take 7.5 mg by mouth at bedtime. 10/03/21  Yes [provider]  Multiple Vitamins-Minerals (MULTIVITAMIN PO) Take 1 tablet by mouth daily.   Yes [provider]  Olopatadine HCl (PATADAY) 0.7 % SOLN Place 1 drop into both eyes daily.   Yes [provider]  Polyethyl  Glycol-Propyl Glycol (SYSTANE) 0.4-0.3 % SOLN Place 1 drop into both eyes in the morning and at bedtime.   Yes [provider]  propranolol ER (INDERAL LA) 60 MG 24 hr capsule Take 60 mg by mouth daily. 09/12/21  Yes [provider]  rosuvastatin (CRESTOR) 10 MG tablet Take 10 mg by mouth at bedtime. 09/25/21  Yes [provider]  simethicone (MYLICON) 80 MG chewable tablet Chew 80 mg by mouth 3 (three) times daily.   Yes [provider]  traMADol (ULTRAM) 50 MG tablet Take 50 mg by mouth 2 (two) times daily as needed. 07/12/21  Yes [provider]  Travoprost (TRAVATAN Z OP) Place 1 drop into both eyes at bedtime. One drop at bedtime in each eye   Yes [provider]  aspirin EC 81 MG tablet Take 81 mg by mouth daily. Swallow whole. Patient not taking: Reported on 09/23/2021    [provider]  ergocalciferol (VITAMIN D2) 1.25 MG (50000 UT) capsule Take 50,000 Units by mouth every Monday.    [provider]  feeding supplement (ENSURE ENLIVE / ENSURE PLUS) LIQD Take 237 mLs by mouth 2 (two) times daily between meals. 06/07/20   Lorin Glass, MD  ferrous sulfate 325 (65 FE) MG tablet Take 325 mg by mouth 3 (three) times a week. Monday, Wednesday and friday Patient not taking: Reported on 10/02/2021    [provider]  fluticasone (FLONASE) 50 MCG/ACT nasal spray Place 1 spray into both nostrils daily. Patient not taking: Reported on 09/30/2021    [provider]  losartan (COZAAR) 50 MG tablet Take 1 tablet (50 mg total) by mouth daily. 06/06/20 07/26/20  Lorin Glass, MD  metoprolol succinate (TOPROL-XL) 50 MG 24 hr tablet Take 50 mg by mouth daily. Patient not taking: Reported on 09/22/2021 03/21/20   [provider]  omeprazole (PRILOSEC) 40 MG capsule Take 40 mg by mouth daily. Patient not taking: Reported on 07/26/2020 03/21/20   [provider]  Phenylephrine-DM-GG (ROBITUSSIN CHILD COUGH/COLD CF)  2.05-19-48 MG/5ML LIQD Take 10 mLs by mouth every 6 (six) hours as needed. Patient not taking: Reported on 09/22/2021    [provider]  rifaximin (XIFAXAN) 550 MG TABS tablet Take 550 mg by mouth 3 (three) times daily. Patient not taking: Reported on 09/21/2021    [provider]   Scheduled Meds:  Chlorhexidine Gluconate Cloth  6 each Topical Q0600   insulin aspart  0-5 Units Subcutaneous QHS   insulin aspart  0-9 Units Subcutaneous TID WC   Continuous Infusions:  sodium chloride     heparin  norepinephrine (LEVOPHED) Adult infusion 3 mcg/min (04/22/2021 0044)   PRN Meds:.docusate, polyethylene glycol   Active Hospital Problem list     Assessment & Plan:  Acute on Chronic Hypoxic Respiratory Failure secondary to CAP On chronic home 2 @3L  -Supplemental O2 as needed to maintain O2 saturations 88 to 92% -High risk for intubation -Follow intermittent ABG and chest x-ray as needed -As needed bronchodilators   Sepsis in the setting of suspected Pneumonia Meets SIRS Criteria  -Monitor fever curve -Trend WBC's & Procalcitonin -Follow cultures as above -Continue empiric abx pending cultures & sensitivities  Shock: suspect Cardiogenic +/- Septic NSTEMI Elevated Troponin Hypertension, A-fib, HLD  -Continuous cardiac monitoring -Maintain MAP >65 -Vasopressors as needed to maintain MAP goal -Trend lactic acid until normalized -Trend HS Troponin until peaked (232 ~ 332~ ) -BNP pending -Start Heparin gtt  -Hold home Lasix as blood pressure and renal function permits -Repeat 2D Echocardiogram   AKI likely ATN int he setting of above Hyperkalemia Lactic acidosis -Monitor I&O's / urinary output -Follow BMP -Ensure adequate renal perfusion -Avoid nephrotoxic agents as able -Replace electrolytes as indicated   Diabetes mellitus HgbA1c pending -CBG's AC & hs; Target range of 140 to 180 -SSI -Follow ICU Hypo/Hyperglycemia protocol  Chronic Diarrhea of  unknown source KUB concerning for ileus vs SBO -NPO -NGT for decompression -Previously evaluated by GI on 11/07/20. Had Celiac Disease Panel , GI Profile, Stool, PCR,  Calprotectin, Fecal, Giardia, EIA; Ova/Parasite, C-Reactive Protein, Quant  which were all negative. -General Surgery consult  Anemia due to chronic illness - Ferritin - Iron Panel     Best practice:  Diet:  NPO Pain/Anxiety/Delirium protocol (if indicated): No VAP protocol (if indicated): Not indicated DVT prophylaxis: Subcutaneous Heparin GI prophylaxis: PPI Glucose control:  SSI Yes Central venous access:  Yes, and it is still needed Arterial line:  N/A Foley:  Yes, and it is still needed Mobility:  bed rest  PT consulted: N/A Last date of multidisciplinary goals of care discussion [10/06/2021] Code Status:  full code Disposition: ICU   = Goals of Care = Code Status Order: FULL  Primary Emergency Contact: Freilich,Reginald, Home Phone: [phone] Wishes to pursue full aggressive treatment and intervention options, including CPR and intubation, but goals of care will be addressed on going with family if that should become necessary.  Critical care time: 45 minutes       Webb SilversmithElizabeth Laiyah Exline, DNP, CCRN, FNP-C, AGACNP-BC Acute Care Nurse Practitioner [facility]  PCCM on call pager [phone] until 7 am

## 2021-10-05 NOTE — IPAL (Signed)
  Interdisciplinary Goals of Care Family Meeting   Date carried out: 10/21/21  Location of the meeting: Bedside  Member's involved: Physician, Nurse Practitioner, and Family Member or next of kin    GOALS OF CARE DISCUSSION  The Clinical status was relayed to family in [place] at Bedside  Updated and notified of patients medical condition- Patient remains unresponsive and will not open eyes to command.   Patient is having a weak cough and struggling to remove secretions.   Patient with increased WOB and using accessory muscles to breathe Explained to family course of therapy and the modalities   Patient with Progressive multiorgan failure with a very high probablity of a very minimal chance of meaningful recovery despite all aggressive and optimal medical therapy.    Family understands the situation. Bowels are not working Kidneys are not working Brain damage from encephalopathy  He has consented and agreed to DNR/DNI status  Family are satisfied with Plan of action and management. All questions answered  Additional CC time 35 mins   Keyairra Kolinski Patricia Pesa, M.D.  [facility]  Medical Director [place] Director [facility] Cardio-Pulmonary Department

## 2021-10-05 NOTE — Progress Notes (Signed)
Initial Nutrition Assessment  DOCUMENTATION CODES:   Not applicable  INTERVENTION:   RD will monitor for diet advancement vs the need for nutrition support  Pt at high refeed risk  Check vitamins zinc, copper, niacin, A and D labs  NUTRITION DIAGNOSIS:   Inadequate oral intake related to acute illness as evidenced by NPO status.  GOAL:   Patient will meet greater than or equal to 90% of their needs  MONITOR:   Labs, Weight trends, Skin, I & O's, Diet advancement  REASON FOR ASSESSMENT:   Rounds    ASSESSMENT:   88 y/o female with h/o DM, Afib, HTN, GERD, HLD, B12 deficiency, DVT and chronic diarrhea who is admitted with AKI, PNA, sepsis, fecal impaction and possible ileus/SBO.  Visited pt's room today. Pt is unable to provide nutrition related history at this time. Pt is bed bound at baseline but per family report, pt is able to converse with people normally. Per chart review, pt with chronic diarrhea for the past year. Pt was worked up by GI last October; fecal calprotectin, celiac panel, GI profile and stool PCR were all negative at that time. Pt does not have any history of previous bowel surgeries noted in chart. Per chart, pt appears to have weight gain pta. RD suspects pt with good appetite and oral intake at baseline. Pt currently NPO. CT scan today reports fecal impaction and stercoral colitis; pt also noted to have dilated small bowel possibly related to ileus vs SBO. Pt noted to have pancreas atrophy; would recommend checking a fecal elastase. RD will check vitamin labs where deficiency can lead to diarrhea. RD will monitor for diet advancement vs the need for nutrition support. Pt is at high refeed risk. Will add supplements and vitamins with diet advancement.    Medications reviewed and include: insulin, heparin, levophed, zosyn   Labs reviewed: K 5.0 wnl, BUN 60(H), creat 2.53(H), P 5.2(H), Mg 2.2 wnl Iron 11(L), TIBC 186, ferritin 157, folate 15.0, B12 457  Hgb  8.5(L), Hct 25.7(L), MCV 71.8(L), 23.7(L) Cbgs- 112, 133, 126 x 24 hrs AIC 6.3(H)  NUTRITION - FOCUSED PHYSICAL EXAM:  Flowsheet Row Most Recent Value  Orbital Region No depletion  Upper Arm Region No depletion  Thoracic and Lumbar Region No depletion  Buccal Region No depletion  Temple Region Mild depletion  Clavicle Bone Region No depletion  Clavicle and Acromion Bone Region No depletion  Scapular Bone Region No depletion  Dorsal Hand Moderate depletion  Patellar Region No depletion  Anterior Thigh Region No depletion  Posterior Calf Region No depletion  Edema (RD Assessment) Mild  Hair Reviewed  Eyes Reviewed  Mouth Reviewed  Skin Reviewed  Nails Reviewed   Diet Order:   Diet Order             Diet NPO time specified  Diet effective now                  EDUCATION NEEDS:   No education needs have been identified at this time  Skin:  Skin Assessment: Reviewed RN Assessment (Stage II L & R buttocks)  Last BM:  9/21- TYPE 5  Height:   Ht Readings from Last 1 Encounters:  09/15/2021 5\' 2"  (1.575 m)    Weight:   Wt Readings from Last 1 Encounters:  10/04/2021 63.5 kg    Ideal Body Weight:  50 kg  BMI:  Body mass index is 25.6 kg/m.  Estimated Nutritional Needs:   Kcal:  1400-1600kcal/day  Protein:  70-80g/day  Fluid:  1.3-1.5L/day  Koleen Distance MS, RD, LDN Please refer to [facility] for RD and/or RD on-call/weekend/after hours pager

## 2021-10-05 NOTE — Progress Notes (Signed)
[place] for Heparin Infusion Indication: ACS/STEMI / Hx DVT on apixaban PTA  Patient Measurements: Height: 5\' 2"  (157.5 cm) Weight: 63.5 kg (139 lb 15.9 oz) IBW/kg (Calculated) : 50.1 Heparin Dosing Weight: 63.5  Labs: Recent Labs    09/24/2021 1939 10/01/2021 2305 18-Oct-2021 0505 10/18/2021 1101  HGB 8.6*  --  8.5*  --   HCT 27.2*  --  25.7*  --   PLT 362  --  349  --   APTT  --   --   --  >200*  LABPROT  --   --   --  33.0*  INR  --   --   --  3.3*  HEPARINUNFRC  --   --   --  >1.10*  CREATININE  --  2.55* 2.53*  --   TROPONINIHS 232* 332* 381*  --      Estimated Creatinine Clearance: 13.2 mL/min (A) (by C-G formula based on SCr of 2.53 mg/dL (H)).   Medical History: Past Medical History:  Diagnosis Date   Atrial fibrillation (HCC)    Diabetes ([place])    GERD (gastroesophageal reflux disease)    Glaucoma    Hypertension     Medications:  Apixaban PTA  Assessment: Pt is a 88 yo female presenting to ED due to abnormal vitals and decreased responsiveness, found with elevated Troponin I lvl, trending up.  Baseline INR quite elevated to 3.3. Not clear etiology of this. Does not appear to have significant hepatic dysfunction. Could be secondary to nutritional deficiency. No baseline aPTT available. No baseline anti-Xa level available.  Goal of Therapy:  Heparin level 0.3-0.7 units/ml aPTT 66-102 seconds Monitor platelets by anticoagulation protocol: Yes   Plan:  --aPTT > 200 & HL > 1.1. No baseline to reference --Hold heparin infusion x 1 hour and then re-start at decrease rate of 550 units/hr --Re-check aPTT 8 hours after re-start; follow aPTT until correlation established with anti-Xa level given presumed interference of PTA apixaban on lab. Will check next anti-Xa tomorrow AM. Will trend INR as well --Daily CBC per protocol while on IV heparin; low threshold recommending stopping anticoagulation if bleeding occurs given evidence  of coagulopathy  [name]  10-18-2021 2:09 PM

## 2021-10-05 NOTE — Progress Notes (Signed)
*  PRELIMINARY RESULTS* Echocardiogram 2D Echocardiogram has been performed.  Ebony Torres 10/06/2021, 1:01 PM

## 2021-10-05 NOTE — Progress Notes (Signed)
ANTICOAGULATION CONSULT NOTE  Pharmacy Consult for heparin infusion Indication: ACS/STEMI  No Known Allergies  Patient Measurements: Height: 5\' 2"  (157.5 cm) Weight: 63.5 kg (139 lb 15.9 oz) IBW/kg (Calculated) : 50.1 Heparin Dosing Weight: 63.5  Vital Signs: Temp: 98.1 F (36.7 C) (09/21 0202) Temp Source: Axillary (09/21 0202) BP: 105/53 (09/21 0115) Pulse Rate: 90 (09/21 0115)  Labs: Recent Labs    10-Oct-2021 1939 10-10-21 2305  HGB 8.6*  --   HCT 27.2*  --   PLT 362  --   CREATININE  --  2.55*  TROPONINIHS 232* 332*    Estimated Creatinine Clearance: 13.1 mL/min (A) (by C-G formula based on SCr of 2.55 mg/dL (H)).   Medical History: Past Medical History:  Diagnosis Date   Atrial fibrillation (HCC)    Diabetes ([place])    GERD (gastroesophageal reflux disease)    Glaucoma    Hypertension     Medications:  PTA Meds: Apixaban Pt initially ordered heparin 5000 unit q8h, given 0214  Assessment: Pt is a 88 yo female presenting to ED due to abnormal vitals and decreased responsiveness, found with elevated Troponin I lvl, trending up.  Goal of Therapy:  Heparin level 0.3-0.7 units/ml aPTT 66-102 seconds Monitor platelets by anticoagulation protocol: Yes   Plan:  Initial bolus dose already given Start heparin infusion at 800 units/hr Will follow aPTT until correlation w/ HL confirmed Will check aPTT in 8 hr after start of infusion HL & CBC daily while on heparin  Renda Rolls, PharmD, [facility] 09/28/2021 2:37 AM

## 2021-10-05 NOTE — ED Notes (Signed)
IV Team at bedside 

## 2021-10-06 LAB — CBC
HCT: 24.7 % — ABNORMAL LOW (ref 36.0–46.0)
Hemoglobin: 8.4 g/dL — ABNORMAL LOW (ref 12.0–15.0)
MCH: 23.5 pg — ABNORMAL LOW (ref 26.0–34.0)
MCHC: 34 g/dL (ref 30.0–36.0)
MCV: 69 fL — ABNORMAL LOW (ref 80.0–100.0)
Platelets: 313 10*3/uL (ref 150–400)
RBC: 3.58 MIL/uL — ABNORMAL LOW (ref 3.87–5.11)
RDW: 15.9 % — ABNORMAL HIGH (ref 11.5–15.5)
WBC: 13.4 10*3/uL — ABNORMAL HIGH (ref 4.0–10.5)
nRBC: 1.3 % — ABNORMAL HIGH (ref 0.0–0.2)

## 2021-10-06 LAB — URINE CULTURE: Culture: NO GROWTH

## 2021-10-06 LAB — PROCALCITONIN: Procalcitonin: 29.3 ng/mL

## 2021-10-06 LAB — PHOSPHORUS: Phosphorus: 4.6 mg/dL (ref 2.5–4.6)

## 2021-10-06 LAB — LEGIONELLA PNEUMOPHILA SEROGP 1 UR AG: L. pneumophila Serogp 1 Ur Ag: NEGATIVE

## 2021-10-06 LAB — VITAMIN D 25 HYDROXY (VIT D DEFICIENCY, FRACTURES): Vit D, 25-Hydroxy: 32.47 ng/mL (ref 30–100)

## 2021-10-06 LAB — BASIC METABOLIC PANEL
Anion gap: 11 (ref 5–15)
BUN: 69 mg/dL — ABNORMAL HIGH (ref 8–23)
CO2: 20 mmol/L — ABNORMAL LOW (ref 22–32)
Calcium: 7.1 mg/dL — ABNORMAL LOW (ref 8.9–10.3)
Chloride: 104 mmol/L (ref 98–111)
Creatinine, Ser: 2.95 mg/dL — ABNORMAL HIGH (ref 0.44–1.00)
GFR, Estimated: 15 mL/min — ABNORMAL LOW (ref >60)
Glucose, Bld: 123 mg/dL — ABNORMAL HIGH (ref 70–99)
Potassium: 5.2 mmol/L — ABNORMAL HIGH (ref 3.5–5.1)
Sodium: 135 mmol/L (ref 135–145)

## 2021-10-06 LAB — GLUCOSE, CAPILLARY
Glucose-Capillary: 118 mg/dL — ABNORMAL HIGH (ref 70–99)
Glucose-Capillary: 48 mg/dL — ABNORMAL LOW (ref 70–99)
Glucose-Capillary: 60 mg/dL — ABNORMAL LOW (ref 70–99)
Glucose-Capillary: 70 mg/dL (ref 70–99)
Glucose-Capillary: 70 mg/dL (ref 70–99)
Glucose-Capillary: 82 mg/dL (ref 70–99)
Glucose-Capillary: 87 mg/dL (ref 70–99)

## 2021-10-06 LAB — MAGNESIUM: Magnesium: 2.4 mg/dL (ref 1.7–2.4)

## 2021-10-06 MED ORDER — DEXTROSE 50 % IV SOLN
INTRAVENOUS | Status: AC
  Filled 2021-10-06: qty 50

## 2021-10-06 MED ORDER — ORAL CARE MOUTH RINSE
15.0000 mL | OROMUCOSAL | Status: DC
  Administered 2021-10-06 (×3): 15 mL via OROMUCOSAL

## 2021-10-06 MED ORDER — DEXTROSE 50 % IV SOLN
INTRAVENOUS | Status: AC
  Administered 2021-10-06: 25 g via INTRAVENOUS
  Filled 2021-10-06: qty 50

## 2021-10-06 MED ORDER — DOCUSATE SODIUM 283 MG RE ENEM: 1.0000 | ENEMA | Freq: Once | RECTAL | Status: DC

## 2021-10-06 MED ORDER — DEXTROSE 50 % IV SOLN
12.5000 g | INTRAVENOUS | Status: AC
  Administered 2021-10-06: 12.5 g via INTRAVENOUS

## 2021-10-06 MED ORDER — MINERAL OIL RE ENEM
1.0000 | ENEMA | Freq: Once | RECTAL | Status: AC
  Administered 2021-10-06: 1 via RECTAL

## 2021-10-06 MED ORDER — MORPHINE SULFATE (PF) 2 MG/ML IV SOLN
2.0000 mg | INTRAVENOUS | Status: DC | PRN
  Administered 2021-10-06 – 2021-10-07 (×3): 2 mg via INTRAVENOUS
  Filled 2021-10-06 (×3): qty 1

## 2021-10-06 MED ORDER — DEXTROSE 50 % IV SOLN: 25.0000 g | INTRAVENOUS | Status: AC

## 2021-10-06 MED ORDER — DEXTROSE 5 % IV SOLN: INTRAVENOUS | Status: DC

## 2021-10-06 MED ORDER — ORAL CARE MOUTH RINSE: 15.0000 mL | OROMUCOSAL | Status: DC | PRN

## 2021-10-06 NOTE — IPAL (Signed)
GOALS OF CARE FAMILY CONFERENCE   Current clinical status, hospital findings and medical plan was reviewed with family.   Updated and notified of patients ongoing immediate critical medical problems.   Patient remains poorly responsive in septic shock with severe and multiple long standing medical comoribidities    Patient is unable to breathe independently, unable to protect airway and unable to mobilize secretions.    Explained to family course of therapy and the modalities   Patient with Progressive multiorgan failure with high probability of a very minimal chance of meaningful recovery despite aggressive and optimal medical therapy.   Family is appreciative of care and relate understanding that patient is severely critically ill with anticipation of passing away during this hospitalization.   They have consented and agreed to DNR/DNI COMFORT CARE Code status   Family are satisfied with Plan of action and management. All questions answered  Additional Critical Care time 35 mins    Ottie Glazier, M.D.  Pulmonary & [place]

## 2021-10-06 NOTE — Consult Note (Signed)
[facility]  CONSULT NOTE    Date: 10/06/2021                  Patient Name:  Ebony Torres  MRN: [id number]  DOB: 1930/04/16  Age / Sex: 88 y.o., female         PCP: Idelle Crouch, MD                 Service Requesting Consult: Critical Care                 Reason for Consult: Acute kidney injury            History of Present Illness: Ebony Torres is a 88 y.o.  female with past medical history of diabetes, hypertension, hyperlipidemia, chronic DVT on Eliquis, who was admitted to [facility] on 09/16/2021 for Elevated lactic acid level [R79.89] Sepsis with hypotension ([place]) [A41.9, I95.9] Pneumonia due to infectious organism, unspecified laterality, unspecified part of lung [J18.9]   Patient presents to the emergency room for shortness of breath. Patient currently resides at [facility]. Staff called due to respiratory distress. Patient seen and evaluated at bedside in ICU. Brother and other family at bedside. Patient appears ill appearing, NGT in right nare to LWS, bile drainage. Currently on Vaso for hypotension. Productive cough. Peripheral edema in lower extremities 1+.   Labs on ED arrival include sodium 138, potassium 5.2, glucose 174, BUN 61, Creatinine 2.55 with GFR 18. Troponin 332 with lactic acid 4.7. UA appears cloudy hematuria and mild proteinuria.    Medications: Outpatient medications: Medications Prior to Admission  Medication Sig Dispense Refill Last Dose   acetaminophen (TYLENOL) 325 MG tablet Take 650 mg by mouth every 6 (six) hours as needed.   10/10/2021 at 1746   acidophilus (RISAQUAD) CAPS capsule Take 1 capsule by mouth daily.   10/10/2021 at 0939   apixaban (ELIQUIS) 2.5 MG TABS tablet Take 1 tablet (2.5 mg total) by mouth 2 (two) times daily. 60 tablet 1 09/29/2021 at 0546   carbidopa-levodopa (SINEMET IR) 25-100 MG tablet Take 1 tablet by mouth in the morning and at bedtime.   10/03/2021 at 1001   famotidine (PEPCID) 10 MG tablet  Take 10 mg by mouth at bedtime.   10/03/2021 at 2040   furosemide (LASIX) 20 MG tablet Take 1 tablet by mouth every other day.   10/03/2021 at 1001   loperamide (IMODIUM) 2 MG capsule Take 1 capsule (2 mg total) by mouth 2 (two) times daily as needed for diarrhea or loose stools. 30 capsule 0 10/06/2021 at 1200   loratadine (CLARITIN) 10 MG tablet Take 10 mg by mouth daily.   09/30/2021 at 0939   mirtazapine (REMERON) 7.5 MG tablet Take 7.5 mg by mouth at bedtime.   10/03/2021 at 2040   Multiple Vitamins-Minerals (MULTIVITAMIN PO) Take 1 tablet by mouth daily.   10/03/2021 at 0939   Olopatadine HCl (PATADAY) 0.7 % SOLN Place 1 drop into both eyes daily.   10/06/2021 at 0939   Polyethyl Glycol-Propyl Glycol (SYSTANE) 0.4-0.3 % SOLN Place 1 drop into both eyes in the morning and at bedtime.   09/26/2021 at 1746   propranolol ER (INDERAL LA) 60 MG 24 hr capsule Take 60 mg by mouth daily.   09/23/2021 at 0939   rosuvastatin (CRESTOR) 10 MG tablet Take 10 mg by mouth at bedtime.   10/03/2021 at 2040   simethicone (MYLICON) 80 MG chewable tablet Chew 80 mg by  mouth 3 (three) times daily.   09/24/2021 at 1512   traMADol (ULTRAM) 50 MG tablet Take 50 mg by mouth 2 (two) times daily as needed.   10/03/2021 at 1235   Travoprost (TRAVATAN Z OP) Place 1 drop into both eyes at bedtime. One drop at bedtime in each eye   10/03/2021 at 2040   aspirin EC 81 MG tablet Take 81 mg by mouth daily. Swallow whole. (Patient not taking: Reported on 09/28/2021)   Not Taking   ergocalciferol (VITAMIN D2) 1.25 MG (50000 UT) capsule Take 50,000 Units by mouth every Monday.   10/02/2021 at 0935   feeding supplement (ENSURE ENLIVE / ENSURE PLUS) LIQD Take 237 mLs by mouth 2 (two) times daily between meals. 237 mL 12    ferrous sulfate 325 (65 FE) MG tablet Take 325 mg by mouth 3 (three) times a week. Monday, Wednesday and friday (Patient not taking: Reported on 09/22/2021)   Not Taking   fluticasone (FLONASE) 50 MCG/ACT nasal spray Place 1 spray  into both nostrils daily. (Patient not taking: Reported on 10/08/2021)   Not Taking   losartan (COZAAR) 50 MG tablet Take 1 tablet (50 mg total) by mouth daily.      metoprolol succinate (TOPROL-XL) 50 MG 24 hr tablet Take 50 mg by mouth daily. (Patient not taking: Reported on 09/29/2021)   Not Taking   omeprazole (PRILOSEC) 40 MG capsule Take 40 mg by mouth daily. (Patient not taking: Reported on 07/26/2020)   Not Taking   Phenylephrine-DM-GG (ROBITUSSIN CHILD COUGH/COLD CF) 2.05-19-48 MG/5ML LIQD Take 10 mLs by mouth every 6 (six) hours as needed. (Patient not taking: Reported on 09/20/2021)   Not Taking   rifaximin (XIFAXAN) 550 MG TABS tablet Take 550 mg by mouth 3 (three) times daily. (Patient not taking: Reported on 10/01/2021)   Not Taking    Current medications: Current Facility-Administered Medications  Medication Dose Route Frequency Provider Last Rate Last Admin   0.9 %  sodium chloride infusion  250 mL Intravenous Continuous Nance Pear, MD       cefTRIAXone (ROCEPHIN) 2 g in sodium chloride 0.9 % 100 mL IVPB  2 g Intravenous Q24H Lang Snow, NP   Stopped at 10/06/21 0542   Chlorhexidine Gluconate Cloth 2 % PADS 6 each  6 each Topical Q0600 Flora Lipps, MD   6 each at 10/06/21 1220   docusate (COLACE) 50 MG/5ML liquid 100 mg  100 mg Per Tube BID PRN Lang Snow, NP       morphine (PF) 2 MG/ML injection 1 mg  1 mg Intravenous Q1H PRN Flora Lipps, MD   1 mg at 10/06/21 0804   norepinephrine (LEVOPHED) 16 mg in 283mL premix infusion  0-40 mcg/min Intravenous Titrated [name], RPH 14.06 mL/hr at 10/06/21 1212 15 mcg/min at 10/06/21 1212   Oral care mouth rinse  15 mL Mouth Rinse 4 times per day Ottie Glazier, MD   15 mL at 10/06/21 1151   Oral care mouth rinse  15 mL Mouth Rinse PRN Ottie Glazier, MD       polyethylene glycol (MIRALAX / GLYCOLAX) packet 17 g  17 g Per Tube Daily PRN Lang Snow, NP          Allergies: No Known  Allergies    Past Medical History: Past Medical History:  Diagnosis Date   Atrial fibrillation (HCC)    Diabetes ([place])    GERD (gastroesophageal reflux disease)    Glaucoma  Hypertension      Past Surgical History: Past Surgical History:  Procedure Laterality Date   ABDOMINAL HYSTERECTOMY     HEMORRHOID SURGERY       Family History: Family History  Problem Relation Age of Onset   Breast cancer Neg Hx      Social History: Social History   Socioeconomic History   Marital status: Widowed    Spouse name: Not on file   Number of children: Not on file   Years of education: Not on file   Highest education level: Not on file  Occupational History   Not on file  Tobacco Use   Smoking status: Former    Types: Cigarettes   Smokeless tobacco: Never  Substance and Sexual Activity   Alcohol use: No   Drug use: Not on file   Sexual activity: Not on file  Other Topics Concern   Not on file  Social History Narrative   Not on file   Social Determinants of Health   Financial Resource Strain: Not on file  Food Insecurity: Not on file  Transportation Needs: Not on file  Physical Activity: Not on file  Stress: Not on file  Social Connections: Not on file  Intimate Partner Violence: Not on file     Review of Systems: Review of Systems  Constitutional:  Negative for chills, fever and malaise/fatigue.  HENT:  Negative for congestion, sore throat and tinnitus.   Eyes:  Negative for blurred vision and redness.  Respiratory:  Positive for shortness of breath. Negative for cough and wheezing.   Cardiovascular:  Negative for chest pain, palpitations, claudication and leg swelling.  Gastrointestinal:  Negative for abdominal pain, blood in stool, diarrhea, nausea and vomiting.  Genitourinary:  Negative for flank pain, frequency and hematuria.  Musculoskeletal:  Negative for back pain, falls and myalgias.  Skin:  Negative for rash.  Neurological:  Negative for dizziness,  weakness and headaches.  Endo/Heme/Allergies:  Does not bruise/bleed easily.  Psychiatric/Behavioral:  Negative for depression. The patient is not nervous/anxious and does not have insomnia.     Vital Signs: Blood pressure 113/70, pulse (!) 116, temperature 99.3 F (37.4 C), temperature source Axillary, resp. rate (!) 30, height 5\' 2"  (1.575 m), weight 65.4 kg, SpO2 97 %.  Weight trends: Filed Weights   10/06/2021 1930 10/13/2021 0202 10/06/21 0500  Weight: 65.9 kg 63.5 kg 65.4 kg    Physical Exam: General: Ill appearing  Head: Normocephalic, atraumatic. Moist oral mucosal membranes  Eyes: Anicteric  Lungs:  Scattered Rhonchi, normal effort  Heart: Regular rate and rhythm  Abdomen:  Soft, nontender  Extremities:  1+ peripheral edema.  Neurologic: Nonfocal, moving all four extremities  Skin: No lesions  Access: none     Lab results: Basic Metabolic Panel: Recent Labs  Lab 09/19/2021 0505 09/21/2021 1429 10/06/2021 1955 10/06/21 0521  NA 136 131* 134* 135  K 5.0 5.5* 5.1 5.2*  CL 108 102 102 104  CO2 17* 17* 20* 20*  GLUCOSE 172* 110* 99 123*  BUN 60* 61* 64* 69*  CREATININE 2.53* 2.61* 2.74* 2.95*  CALCIUM 7.9* 7.5* 7.3* 7.1*  MG 2.2  --   --  2.4  PHOS 5.2*  --   --  4.6    Liver Function Tests: Recent Labs  Lab 09/24/2021 0505  AST 39  ALT 12  ALKPHOS 78  BILITOT 1.0  PROT 5.8*  ALBUMIN 2.5*   No results for input(s): "LIPASE", "AMYLASE" in the last 168 hours. No  results for input(s): "AMMONIA" in the last 168 hours.  CBC: Recent Labs  Lab 10/13/2021 1939 09/23/2021 0505 10/06/21 0521  WBC 16.1* 6.9 13.4*  HGB 8.6* 8.5* 8.4*  HCT 27.2* 25.7* 24.7*  MCV 74.7* 71.8* 69.0*  PLT 362 349 313    Cardiac Enzymes: No results for input(s): "CKTOTAL", "CKMB", "CKMBINDEX", "TROPONINI" in the last 168 hours.  BNP: Invalid input(s): "POCBNP"  CBG: Recent Labs  Lab 10/06/21 0015 10/06/21 0322 10/06/21 0728 10/06/21 1132 10/06/21 1209  GLUCAP 48* 118* 70  60* 82    Microbiology: Results for orders placed or performed during the hospital encounter of 09/26/2021  SARS Coronavirus 2 by RT PCR (hospital order, performed in [facility] hospital lab) *cepheid single result test* Anterior Nasal Swab     Status: None   Collection Time: 10/03/2021  8:11 PM   Specimen: Anterior Nasal Swab  Result Value Ref Range Status   SARS Coronavirus 2 by RT PCR NEGATIVE NEGATIVE Final    Comment: (NOTE) SARS-CoV-2 target nucleic acids are NOT DETECTED.  The SARS-CoV-2 RNA is generally detectable in upper and lower respiratory specimens during the acute phase of infection. The lowest concentration of SARS-CoV-2 viral copies this assay can detect is 250 copies / mL. A negative result does not preclude SARS-CoV-2 infection and should not be used as the sole basis for treatment or other patient management decisions.  A negative result may occur with improper specimen collection / handling, submission of specimen other than nasopharyngeal swab, presence of viral mutation(s) within the areas targeted by this assay, and inadequate number of viral copies (<250 copies / mL). A negative result must be combined with clinical observations, patient history, and epidemiological information.  Fact Sheet for Patients:   RoadLapTop.co.za  Fact Sheet for Healthcare Providers: http://kim-miller.com/  This test is not yet approved or  cleared by the [place] FDA and has been authorized for detection and/or diagnosis of SARS-CoV-2 by FDA under an Emergency Use Authorization (EUA).  This EUA will remain in effect (meaning this test can be used) for the duration of the COVID-19 declaration under Section 564(b)(1) of the Act, 21 U.S.C. section 360bbb-3(b)(1), unless the authorization is terminated or revoked sooner.  Performed at [facility], [address] [place], [place] [address]   Blood culture (routine x 2)      Status: None (Preliminary result)   Collection Time: 09/15/2021  8:37 PM   Specimen: BLOOD  Result Value Ref Range Status   Specimen Description BLOOD BLOOD RIGHT ARM  Final   Special Requests   Final    BOTTLES DRAWN AEROBIC AND ANAEROBIC Blood Culture adequate volume   Culture   Final    NO GROWTH 2 DAYS Performed at [facility] [address], [place], [place] [address]    Report Status PENDING  Incomplete  Blood culture (routine x 2)     Status: None (Preliminary result)   Collection Time: 09/17/2021  8:37 PM   Specimen: BLOOD  Result Value Ref Range Status   Specimen Description BLOOD BLOOD RIGHT ARM  Final   Special Requests   Final    BOTTLES DRAWN AEROBIC AND ANAEROBIC Blood Culture adequate volume   Culture  Setup Time   Final    Organism ID to follow GRAM NEGATIVE RODS ANAEROBIC BOTTLE ONLY CRITICAL RESULT CALLED TO, READ BACK BY AND VERIFIED WITH: NATHAN BELEUE @2236  ON 09/15/2021 SKL Performed at [facility], [address], [place], [place] [address]  Culture GRAM NEGATIVE RODS  Final   Report Status PENDING  Incomplete  Blood Culture ID Panel (Reflexed)     Status: Abnormal   Collection Time: 10/08/2021  8:37 PM  Result Value Ref Range Status   Enterococcus faecalis NOT DETECTED NOT DETECTED Final   Enterococcus Faecium NOT DETECTED NOT DETECTED Final   Listeria monocytogenes NOT DETECTED NOT DETECTED Final   Staphylococcus species NOT DETECTED NOT DETECTED Final   Staphylococcus aureus (BCID) NOT DETECTED NOT DETECTED Final   Staphylococcus epidermidis NOT DETECTED NOT DETECTED Final   Staphylococcus lugdunensis NOT DETECTED NOT DETECTED Final   Streptococcus species NOT DETECTED NOT DETECTED Final   Streptococcus agalactiae NOT DETECTED NOT DETECTED Final   Streptococcus pneumoniae NOT DETECTED NOT DETECTED Final   Streptococcus pyogenes NOT DETECTED NOT DETECTED Final   A.calcoaceticus-baumannii NOT DETECTED NOT DETECTED Final    Bacteroides fragilis NOT DETECTED NOT DETECTED Final   Enterobacterales DETECTED (A) NOT DETECTED Final    Comment: Enterobacterales represent a large order of gram negative bacteria, not a single organism. CRITICAL RESULT CALLED TO, READ BACK BY AND VERIFIED WITH: NATHAN BELEUE @2236  ON 09/15/2021 SKL    Enterobacter cloacae complex NOT DETECTED NOT DETECTED Final   Escherichia coli DETECTED (A) NOT DETECTED Final    Comment: CRITICAL RESULT CALLED TO, READ BACK BY AND VERIFIED WITH: NATHAN BELEUE @2236  ON 09/15/2021 SKL    Klebsiella aerogenes NOT DETECTED NOT DETECTED Final   Klebsiella oxytoca NOT DETECTED NOT DETECTED Final   Klebsiella pneumoniae NOT DETECTED NOT DETECTED Final   Proteus species NOT DETECTED NOT DETECTED Final   Salmonella species NOT DETECTED NOT DETECTED Final   Serratia marcescens NOT DETECTED NOT DETECTED Final   Haemophilus influenzae NOT DETECTED NOT DETECTED Final   Neisseria meningitidis NOT DETECTED NOT DETECTED Final   Pseudomonas aeruginosa NOT DETECTED NOT DETECTED Final   Stenotrophomonas maltophilia NOT DETECTED NOT DETECTED Final   Candida albicans NOT DETECTED NOT DETECTED Final   Candida auris NOT DETECTED NOT DETECTED Final   Candida glabrata NOT DETECTED NOT DETECTED Final   Candida krusei NOT DETECTED NOT DETECTED Final   Candida parapsilosis NOT DETECTED NOT DETECTED Final   Candida tropicalis NOT DETECTED NOT DETECTED Final   Cryptococcus neoformans/gattii NOT DETECTED NOT DETECTED Final   CTX-M ESBL NOT DETECTED NOT DETECTED Final   Carbapenem resistance IMP NOT DETECTED NOT DETECTED Final   Carbapenem resistance KPC NOT DETECTED NOT DETECTED Final   Carbapenem resistance NDM NOT DETECTED NOT DETECTED Final   Carbapenem resist OXA 48 LIKE NOT DETECTED NOT DETECTED Final   Carbapenem resistance VIM NOT DETECTED NOT DETECTED Final    Comment: Performed at [facility], [address] [place], [place] [address]  MRSA Next Gen by PCR,  Nasal     Status: None   Collection Time: 09/22/2021  1:59 AM   Specimen: Nasal Mucosa; Nasal Swab  Result Value Ref Range Status   MRSA by PCR Next Gen NOT DETECTED NOT DETECTED Final    Comment: (NOTE) The GeneXpert MRSA Assay (FDA approved for NASAL specimens only), is one component of a comprehensive MRSA colonization surveillance program. It is not intended to diagnose MRSA infection nor to guide or monitor treatment for MRSA infections. Test performance is not FDA approved in patients less than 73 years old. Performed at [facility], [address], [place], [place] [address]   C Difficile Quick Screen w PCR reflex     Status: None  Collection Time: 10/06/2021  8:20 AM   Specimen: STOOL  Result Value Ref Range Status   C Diff antigen NEGATIVE NEGATIVE Final   C Diff toxin NEGATIVE NEGATIVE Final   C Diff interpretation No C. difficile detected.  Final    Comment: Performed at [facility], [address] [place], [place] [address]  Gastrointestinal Panel by PCR , Stool     Status: None   Collection Time: 09/23/2021  8:20 AM   Specimen: Stool  Result Value Ref Range Status   Campylobacter species NOT DETECTED NOT DETECTED Final   Plesimonas shigelloides NOT DETECTED NOT DETECTED Final   Salmonella species NOT DETECTED NOT DETECTED Final   Yersinia enterocolitica NOT DETECTED NOT DETECTED Final   Vibrio species NOT DETECTED NOT DETECTED Final   Vibrio cholerae NOT DETECTED NOT DETECTED Final   Enteroaggregative E coli (EAEC) NOT DETECTED NOT DETECTED Final   Enteropathogenic E coli (EPEC) NOT DETECTED NOT DETECTED Final   Enterotoxigenic E coli (ETEC) NOT DETECTED NOT DETECTED Final   Shiga like toxin producing E coli (STEC) NOT DETECTED NOT DETECTED Final   Shigella/Enteroinvasive E coli (EIEC) NOT DETECTED NOT DETECTED Final   Cryptosporidium NOT DETECTED NOT DETECTED Final   Cyclospora cayetanensis NOT DETECTED NOT DETECTED Final   Entamoeba histolytica  NOT DETECTED NOT DETECTED Final   Giardia lamblia NOT DETECTED NOT DETECTED Final   Adenovirus F40/41 NOT DETECTED NOT DETECTED Final   Astrovirus NOT DETECTED NOT DETECTED Final   Norovirus GI/GII NOT DETECTED NOT DETECTED Final   Rotavirus A NOT DETECTED NOT DETECTED Final   Sapovirus (I, II, IV, and V) NOT DETECTED NOT DETECTED Final    Comment: Performed at [facility], [address] [place], [place] [address]    Coagulation Studies: Recent Labs    09/25/2021 1101  LABPROT 33.0*  INR 3.3*    Urinalysis: Recent Labs    10/09/2021 2235  COLORURINE AMBER*  LABSPEC 1.020  PHURINE 5.0  GLUCOSEU NEGATIVE  HGBUR LARGE*  BILIRUBINUR MODERATE*  KETONESUR TRACE*  PROTEINUR 100*  NITRITE NEGATIVE  LEUKOCYTESUR TRACE*      Imaging: ECHOCARDIOGRAM COMPLETE  Result Date: 10/09/2021    ECHOCARDIOGRAM REPORT   Patient Name:   Ebony Torres Date of Exam: 09/27/2021 Medical Rec #:  [id number]      Height:       62.0 in Accession #:    [id number]     Weight:       140.0 lb Date of Birth:  Apr 25, 1930       BSA:          1.643 m Patient Age:    71 years       BP:           85/64 mmHg Patient Gender: F              HR:           89 bpm. Exam Location:  [facility] Procedure: 2D Echo, Color Doppler and Cardiac Doppler Indications:     I50.31 congestive heart failure-Acute Diastolic  History:         Patient has no prior history of Echocardiogram examinations.                  Risk Factors:Hypertension and Diabetes.  Sonographer:     Charmayne Sheer Referring Phys:  [place] Diagnosing Phys: Ida Rogue MD  Sonographer Comments: Suboptimal apical window and suboptimal subcostal window. Image acquisition  challenging due to respiratory motion. IMPRESSIONS  1. Left ventricular ejection fraction, by estimation, is 55 to 60%. The left ventricle has normal function. The left ventricle has no regional wall motion abnormalities. There is mild left ventricular hypertrophy. Left  ventricular diastolic parameters are consistent with Grade I diastolic dysfunction (impaired relaxation).  2. Right ventricular systolic function is normal. The right ventricular size is normal. There is normal pulmonary artery systolic pressure. The estimated right ventricular systolic pressure is Q000111Q mmHg.  3. The mitral valve is normal in structure. No evidence of mitral valve regurgitation. No evidence of mitral stenosis.  4. Tricuspid valve regurgitation is mild to moderate.  5. The aortic valve is tricuspid. There is mild calcification of the aortic valve. Aortic valve regurgitation is not visualized. Aortic valve sclerosis/calcification is present, without any evidence of aortic stenosis.  6. The inferior vena cava is normal in size with greater than 50% respiratory variability, suggesting right atrial pressure of 3 mmHg. FINDINGS  Left Ventricle: Left ventricular ejection fraction, by estimation, is 55 to 60%. The left ventricle has normal function. The left ventricle has no regional wall motion abnormalities. The left ventricular internal cavity size was normal in size. There is  mild left ventricular hypertrophy. Left ventricular diastolic parameters are consistent with Grade I diastolic dysfunction (impaired relaxation). Right Ventricle: The right ventricular size is normal. No increase in right ventricular wall thickness. Right ventricular systolic function is normal. There is normal pulmonary artery systolic pressure. The tricuspid regurgitant velocity is 2.33 m/s, and  with an assumed right atrial pressure of 5 mmHg, the estimated right ventricular systolic pressure is Q000111Q mmHg. Left Atrium: Left atrial size was normal in size. Right Atrium: Right atrial size was normal in size. Pericardium: There is no evidence of pericardial effusion. Mitral Valve: The mitral valve is normal in structure. Mild mitral annular calcification. No evidence of mitral valve regurgitation. No evidence of mitral valve  stenosis. Tricuspid Valve: The tricuspid valve is normal in structure. Tricuspid valve regurgitation is mild to moderate. No evidence of tricuspid stenosis. Aortic Valve: The aortic valve is tricuspid. There is mild calcification of the aortic valve. Aortic valve regurgitation is not visualized. Aortic valve sclerosis/calcification is present, without any evidence of aortic stenosis. Aortic valve mean gradient measures 4.0 mmHg. Aortic valve peak gradient measures 7.0 mmHg. Aortic valve area, by VTI measures 1.83 cm. Pulmonic Valve: The pulmonic valve was normal in structure. Pulmonic valve regurgitation is mild. No evidence of pulmonic stenosis. Aorta: The aortic root is normal in size and structure. Venous: The inferior vena cava is normal in size with greater than 50% respiratory variability, suggesting right atrial pressure of 3 mmHg. IAS/Shunts: No atrial level shunt detected by color flow Doppler.  LEFT VENTRICLE PLAX 2D LVIDd:         3.30 cm   Diastology LVIDs:         2.30 cm   LV e' medial:    7.51 cm/s LV PW:         0.90 cm   LV E/e' medial:  8.4 LV IVS:        1.20 cm   LV e' lateral:   7.83 cm/s LVOT diam:     1.90 cm   LV E/e' lateral: 8.0 LV SV:         34 LV SV Index:   21 LVOT Area:     2.84 cm  RIGHT VENTRICLE RV Basal diam:  3.50 cm RV Mid diam:  3.00 cm RV S prime:     12.70 cm/s LEFT ATRIUM           Index        RIGHT ATRIUM           Index LA diam:      2.50 cm 1.52 cm/m   RA Area:     10.80 cm LA Vol (A2C): 24.8 ml 15.10 ml/m  RA Volume:   23.40 ml  14.24 ml/m LA Vol (A4C): 30.1 ml 18.32 ml/m  AORTIC VALVE                    PULMONIC VALVE AV Area (Vmax):    1.56 cm     PV Vmax:       0.76 m/s AV Area (Vmean):   1.52 cm     PV Peak grad:  2.3 mmHg AV Area (VTI):     1.83 cm AV Vmax:           132.00 cm/s AV Vmean:          91.800 cm/s AV VTI:            0.186 m AV Peak Grad:      7.0 mmHg AV Mean Grad:      4.0 mmHg LVOT Vmax:         72.80 cm/s LVOT Vmean:        49.300 cm/s  LVOT VTI:          0.120 m LVOT/AV VTI ratio: 0.65  AORTA Ao Root diam: 3.30 cm MITRAL VALVE                TRICUSPID VALVE MV Area (PHT): 9.60 cm     TR Peak grad:   21.7 mmHg MV Decel Time: 79 msec      TR Vmax:        233.00 cm/s MV E velocity: 62.90 cm/s MV A velocity: 118.00 cm/s  SHUNTS MV E/A ratio:  0.53         Systemic VTI:  0.12 m                             Systemic Diam: 1.90 cm Ida Rogue MD Electronically signed by Ida Rogue MD Signature Date/Time: 09/23/2021/4:45:46 PM    Final    CT ABDOMEN PELVIS WO CONTRAST  Result Date: 09/26/2021 CLINICAL DATA:  Abdominal pain EXAM: CT ABDOMEN AND PELVIS WITHOUT CONTRAST TECHNIQUE: Multidetector CT imaging of the abdomen and pelvis was performed following the standard protocol without IV contrast. RADIATION DOSE REDUCTION: This exam was performed according to the departmental dose-optimization program which includes automated exposure control, adjustment of the mA and/or kV according to patient size and/or use of iterative reconstruction technique. COMPARISON:  Abdominal radiographs done earlier today FINDINGS: Lower chest: There are patchy infiltrates in both lower lung fields. Heart is enlarged in size. Dense calcification is seen in mitral and aortic annulus. Scattered coronary artery calcifications are seen. Possible minimal right pleural effusion is seen. Hepatobiliary: No focal abnormalities are seen in liver. There is no dilation of bile ducts. Gallbladder is not distended. Pancreas: There is atrophy in the pancreas. No focal abnormalities are seen. Spleen: Spleen is smaller than usual in size. Adrenals/Urinary Tract: There is hyperplasia of left adrenal. There is no hydronephrosis. There are multiple smooth marginated lesions of varying sizes in both kidneys. Many of these lesions appear hyperdense. There are no  renal or ureteral stones. Foley catheter is seen in the bladder. Stomach/Bowel: There is contrast in the lumen of lower thoracic  esophagus suggesting possible gastroesophageal reflux. Stomach is distended. There is contrast seen proximal jejunum. There is dilation of proximal small bowel loops measuring up to 3.4 cm in diameter. Appendix is not seen. There is large amount of stool in rectosigmoid. Transverse diameter of rectum measures up to 9.2 cm. Sigmoid colon measures up to 12.5 cm in transverse diameter. There is diffuse wall thickening in rectosigmoid. There is mild stranding in the perirectal region. Vascular/Lymphatic: There are coarse calcifications in abdominal aorta and its major branches. Reproductive: Uterus is not seen. Other: There is no pneumoperitoneum. There is no significant ascites There is subcutaneous edema in the abdominal wall and gluteal regions. Musculoskeletal: No acute findings are seen. IMPRESSION: There is large amount of stool in rectosigmoid. There is diffuse wall thickening in rectosigmoid. Findings suggest fecal impaction and stercoral colitis. There is mild to moderate dilation of proximal small bowel loops without demonstrable zone of transition, possibly suggesting ileus or partial small bowel obstruction due to adhesions. There is no hydronephrosis. There are multiple smooth marginated lesions in both kidneys many of which show high attenuation suggesting possible hemorrhagic cysts. There is edema in abdominal wall and gluteal regions in subcutaneous plane suggesting anasarca. There are patchy infiltrates in both lower lung fields suggesting atelectasis/pneumonia. Possible gastroesophageal reflux. Other findings as described in the body of the report. Electronically Signed   By: Elmer Picker M.D.   On: 09/23/2021 12:28   DG Chest 1 View  Result Date: 09/26/2021 CLINICAL DATA:  Central line placement. EXAM: CHEST  1 VIEW COMPARISON:  09/30/2021 FINDINGS: 0622 hours. Low volume film. The cardio pericardial silhouette is enlarged. Bibasilar atelectasis noted. Right IJ central line is new in the  interval with tip overlying the low right atrium. No pneumothorax or pleural effusion. NG tube tube is new with the tip in the gastric antrum. Proximal side port is below the GE junction. Telemetry leads overlie the chest. IMPRESSION: 1. New right IJ central line tip is in the low right atrium. No pneumothorax or pleural effusion. 2. NG tube tip is in the gastric fundus. 3. Cardiomegaly with bibasilar atelectasis. Electronically Signed   By: Misty Stanley M.D.   On: 10/12/2021 07:11   DG Abd 1 View  Result Date: 10/01/2021 CLINICAL DATA:  Orogastric tube placement EXAM: ABDOMEN - 1 VIEW COMPARISON:  Earlier today FINDINGS: Enteric tube with tip and side port at the stomach. Gas dilated bowel diffusely. Stool distended bowel loops over the lower abdomen. No concerning mass effect or gas collection. IMPRESSION: 1. Enteric tube with tip and side port at the decompressed stomach. 2. Otherwise unchanged bowel gas pattern with rectal stool distention. Electronically Signed   By: Jorje Guild M.D.   On: 09/25/2021 05:23   DG Abd 1 View  Result Date: 09/23/2021 CLINICAL DATA:  Abdominal distension EXAM: ABDOMEN - 1 VIEW COMPARISON:  None Available. FINDINGS: Scattered large and small bowel gas is noted. The small bowel is at the upper limits of normal in size. Findings consistent with rectal impaction are seen. No free air is noted. IMPRESSION: Changes of rectal impaction as well as mildly prominent small bowel which may represent an ileus or early partial small bowel obstruction. Electronically Signed   By: Inez Catalina M.D.   On: 09/27/2021 02:57   DG Chest 1 View  Result Date: 10/08/2021 CLINICAL DATA:  Shortness of  breath EXAM: PORTABLE CHEST 1 VIEW COMPARISON:  09/26/2021 FINDINGS: Cardiac shadow is mildly enlarged but stable. Improved aeration is noted in the left base with only minimal residual atelectasis identified. No new focal infiltrate is seen. No bony abnormality is noted. IMPRESSION: Improved  aeration in the left base. Electronically Signed   By: Inez Catalina M.D.   On: 10/02/2021 02:57   CT Head Wo Contrast  Result Date: 09/28/2021 CLINICAL DATA:  Altered mental status. EXAM: CT HEAD WITHOUT CONTRAST TECHNIQUE: Contiguous axial images were obtained from the base of the skull through the vertex without intravenous contrast. RADIATION DOSE REDUCTION: This exam was performed according to the departmental dose-optimization program which includes automated exposure control, adjustment of the mA and/or kV according to patient size and/or use of iterative reconstruction technique. COMPARISON:  None Available. FINDINGS: Brain: No acute intracranial hemorrhage. No evidence of acute ischemia. Brain volume is normal for age. Mild periventricular and deep white matter hypodensity is nonspecific but typical of chronic small vessel ischemia. In the anterior para fall seen frontal lobe to the right of midline is an 11 x 6 x 12 mm partially calcified lesion consistent with meningioma. There is no associated mass effect. No midline shift. No subdural collection. Vascular: Atherosclerosis of skullbase vasculature without hyperdense vessel or abnormal calcification. Skull: No fracture or focal lesion. Sinuses/Orbits: No acute finding. Other: None. IMPRESSION: 1. No acute intracranial abnormality. 2. Small right frontal meningioma measuring 11 x 6 x 12 mm. No mass effect. This is likely incidental and of doubtful clinical significance, however there are no prior exams available to assess for stability. As clinically indicated, MRI could be considered for characterization. 3. Mild chronic small vessel ischemia. Electronically Signed   By: Keith Rake M.D.   On: 09/20/2021 21:04   DG Chest Portable 1 View  Result Date: 10/03/2021 CLINICAL DATA:  Oxygen desaturation, hypotension, short of breath EXAM: PORTABLE CHEST 1 VIEW COMPARISON:  None Available. FINDINGS: Single frontal view of the chest demonstrates  enlarged cardiac silhouette. Increased retrocardiac density consistent with airspace disease or atelectasis. No large effusion or pneumothorax. No acute bony abnormalities. IMPRESSION: 1. Left lower lobe consolidation which may reflect atelectasis or pneumonia. Electronically Signed   By: Randa Ngo M.D.   On: 10/06/2021 20:08     Assessment & Plan: Ebony Torres is a 88 y.o.  female with past medical history of diabetes, hypertension, hyperlipidemia, chronic DVT on Eliquis, who was admitted to [facility] on 10/03/2021 for Elevated lactic acid level [R79.89] Sepsis with hypotension ([place]) [A41.9, I95.9] Pneumonia due to infectious organism, unspecified laterality, unspecified part of lung [J18.9]  Acute kidney injury likely due to severe illness and hypotension. Normal function last summer. CT abd/pelvis shows hyperplasia of left adrenal without obstruction. No IV contrast exposure. Diuretics held. Creatinine continues to rise daily. Also has oliguria. No acute need for dialysis. Discussed with family that if renal function continues to decline, temporary dialysis may be needed. Also discussed that with patient's current conditions and need for pressors, she may not be able to tolerate it. Family states they would not like dialysis due to her health. We will continue to monitor.   2. Hypertension, essential. Home regimen includes furosemide, propranolol, losartan and metoprolol. All currently held. Receiving Vaso.   3. Chronic diastolic heart failure. ECHO from 10/03/2021 shows EF 55 to 60% with a mild LVH and grade 1 diastolic dysfunction.  Also shows mild calcification of the aortic valve with mild to moderate tricuspid  valve regurgitation.  Currently on heparin drip.  Cardiology following.  LOS: 1 Aadvika Konen 9/22/202312:53 PM

## 2021-10-06 NOTE — Progress Notes (Addendum)
NAME:  Ebony Torres, MRN:  [id number], DOB:  [birth year], LOS: 1 ADMISSION DATE:  10/03/2021, CONSULTATION DATE: 10-21-2021 REFERRING MD:  Phineas Semen CHIEF COMPLAINT:  Shortness of Breath   HPI  88 y.o with significant PMH  of Anemia of Chronic disease, HTN, HLD, T2DM, Vit B12 deficiency, chronic DVT on chronic anticoagulation with Eliquis who presented to the ED from [facility] with chief complaints of SOB.   Per ED reports, EMS was called out due to breathing difficulties. Initial sats  were between 60-70% and placed on 15L NRB sats up to 100%. Pt normally on 3L [place] at baseline  Pt was also hypotensive  with BP readings 70/40 and given 250cc NS with rebound pressure 83/50. Per patient's son, patient also has been having diarrhea for the past 5-6 months.  ED Course: In the emergency department, vital signs showed blood pressure of 93/35 dropped to 65/50 mm Hg, HR of 76 beats per minute, a respiratory rate of 25 breaths per minute, and a temperature of 97.76F (36.6C).  SATS 99% on 5L.  Pertinent Labs/Diagnostics Findings: Chemistry:Na+/ K+:138/5.2  Glucose:174  BUN/Cr.: 61/2.55  AST/ALT: CBC: WBC:16.1 Hgb/Hct: 8.6/27.2 Other Lab findings:   PCT: pending, Lactic acid: 2.6 ~4.7 COVID PCR: Negative, Troponin:232 ~332  Imaging:  CXR> Left lower lobe consolidation which may reflect atelectasis or Pneumonia. CTH> No acute intracranial abnormality. 2. Small right frontal meningioma measuring 11 x 6 x 12 mm. No mass effect  Patient given 30 cc/kg of fluids and started on broad-spectrum antibiotics Vanco cefepime and Flagyl for sepsis with septic shock. Patient remained hypotensive despite IVF boluses therefore was started on Levophed. PCCM consulted.  10/06/21- patient with high stool burden on CT abd, she had nephrology evaluation family does not wish for HD, she is DNR with severe comorbidities, remains in circulatory shock on levophed vasopressor oliguric from foley despite aggressive IVF.   She has ecoli bacteremia as culprit of infection and shock which was present on admission.   Past Medical History  Anemia of Chronic disease, HTN, HLD, T2DM, Vit B12 deficiency, chronic DVT on chronic anticoagulation with Eliquis  Significant Hospital Events   9/21: Admitted with sepsis secondary to suspected pneumonia  Consults:  None  Procedures:  None  Significant Diagnostic Tests:  9/20: Chest Xray>1. Left lower lobe consolidation which may reflect atelectasis or pneumonia. 9/20: Noncontrast CT head>1. No acute intracranial abnormality. 2. Small right frontal meningioma measuring 11 x 6 x 12 mm. No mass effect. This is likely incidental and of doubtful clinical significance, however there are no prior exams available to assess for stability. As clinically indicated, MRI could be considered for characterization. 3. Mild chronic small vessel ischemia  Micro Data:  9/20: SARS-CoV-2 PCR> negative 9/20: Influenza PCR> negative 9/20: Blood culture x2> 9/20: Urine Culture> 9/20: MRSA PCR>>  9/20: Strep pneumo urinary antigen> 9/20: Legionella urinary antigen>  Antimicrobials:  Vancomycin 9/20> Cefepime 9/20>  OBJECTIVE  Blood pressure (!) 79/66, pulse 92, temperature 99.9 F (37.7 C), temperature source Axillary, resp. rate (!) 32, height 5\' 2"  (1.575 m), weight 65.4 kg, SpO2 100 %.        Intake/Output Summary (Last 24 hours) at 10/06/2021 0903 Last data filed at 10/06/2021 0815 Gross per 24 hour  Intake 2432.69 ml  Output 655 ml  Net 1777.69 ml    Filed Weights   10/02/2021 1930 10/21/2021 0202 10/06/21 0500  Weight: 65.9 kg 63.5 kg 65.4 kg    Physical Examination  GENERAL: 88  year-old critically ill patient lying in the bed with no acute distress.  EYES: Pupils equal, round, reactive to light and accommodation. No scleral icterus. Extraocular muscles intact.  HEENT: Head atraumatic, normocephalic. Oropharynx and nasopharynx clear.  NECK:  Supple, no jugular  venous distention. No thyroid enlargement, no tenderness.  LUNGS: Decreased breath sounds bilaterally, mild to moderate wheezing, rales, and diffuse crackles. Mild use of accessory muscles of respiration.  CARDIOVASCULAR: S1, S2 normal. No murmurs, rubs, or gallops.  ABDOMEN: Soft, nontender, nondistended. Bowel sounds present. No organomegaly or mass.  EXTREMITIES: Bilateral lower extremities +2 pitting edema, No cyanosis, or clubbing.  NEUROLOGIC: Cranial nerves II through XII are intact.  Muscle strength 2/5 in bilateral upper extremities. Bilateral contractures R>L. Bilateral foot droop. Sensation intact. Gait not checked.  PSYCHIATRIC: The patient is alert and oriented x 1.  SKIN: Sacral ulcer present on admission  Labs/imaging that I havepersonally reviewed  (right click and "Reselect all SmartList Selections" daily)     Labs   CBC: Recent Labs  Lab 09/20/2021 1939 2021/11/03 0505 10/06/21 0521  WBC 16.1* 6.9 13.4*  HGB 8.6* 8.5* 8.4*  HCT 27.2* 25.7* 24.7*  MCV 74.7* 71.8* 69.0*  PLT 362 349 313     Basic Metabolic Panel: Recent Labs  Lab 10/06/2021 2305 11-03-21 0505 2021-11-03 1429 11-03-21 1955 10/06/21 0521  NA 138 136 131* 134* 135  K 5.2* 5.0 5.5* 5.1 5.2*  CL 109 108 102 102 104  CO2 17* 17* 17* 20* 20*  GLUCOSE 174* 172* 110* 99 123*  BUN 61* 60* 61* 64* 69*  CREATININE 2.55* 2.53* 2.61* 2.74* 2.95*  CALCIUM 8.1* 7.9* 7.5* 7.3* 7.1*  MG  --  2.2  --   --  2.4  PHOS  --  5.2*  --   --  4.6    GFR: Estimated Creatinine Clearance: 11.5 mL/min (A) (by C-G formula based on SCr of 2.95 mg/dL (H)). Recent Labs  Lab 09/24/2021 1939 10/10/2021 2011 09/18/2021 2216 2021-11-03 0505 10/06/21 0521  PROCALCITON  --   --   --  21.39 29.30  WBC 16.1*  --   --  6.9 13.4*  LATICACIDVEN  --  2.6* 4.7* 1.6  --      Liver Function Tests: Recent Labs  Lab November 03, 2021 0505  AST 39  ALT 12  ALKPHOS 78  BILITOT 1.0  PROT 5.8*  ALBUMIN 2.5*   No results for input(s):  "LIPASE", "AMYLASE" in the last 168 hours. No results for input(s): "AMMONIA" in the last 168 hours.  ABG    Component Value Date/Time   PHART 7.34 (L) November 03, 2021 0254   PCO2ART 28 (L) 11/03/2021 0254   PO2ART 95 11/03/21 0254   HCO3 15.1 (L) 11-03-2021 0254   ACIDBASEDEF 9.2 (H) 11-03-2021 0254   O2SAT 100 11-03-21 0254     Coagulation Profile: Recent Labs  Lab 2021-11-03 1101  INR 3.3*    Cardiac Enzymes: No results for input(s): "CKTOTAL", "CKMB", "CKMBINDEX", "TROPONINI" in the last 168 hours.  HbA1C: Hgb A1c MFr Bld  Date/Time Value Ref Range Status  03-Nov-2021 05:15 AM 6.3 (H) 4.8 - 5.6 % Final    Comment:    (NOTE) Pre diabetes:          5.7%-6.4%  Diabetes:              >6.4%  Glycemic control for   <7.0% adults with diabetes   06/04/2020 06:11 AM 6.1 (H) 4.8 - 5.6 % Final  Comment:    (NOTE) Pre diabetes:          5.7%-6.4%  Diabetes:              >6.4%  Glycemic control for   <7.0% adults with diabetes     CBG: Recent Labs  Lab 10/11/2021 1810 09/21/2021 1932 10/06/21 0015 10/06/21 0322 10/06/21 0728  GLUCAP 77 79 48* 118* 70    Review of Systems:   UNABLE TO OBTAIN PATIENT NON VERBAL  Past Medical History  She,  has a past medical history of Atrial fibrillation ([place]), Diabetes ([place]), GERD (gastroesophageal reflux disease), Glaucoma, and Hypertension.   Surgical History    Past Surgical History:  Procedure Laterality Date   ABDOMINAL HYSTERECTOMY     HEMORRHOID SURGERY       Social History   reports that she has quit smoking. Her smoking use included cigarettes. She has never used smokeless tobacco. She reports that she does not drink alcohol.   Family History   Her family history is negative for Breast cancer.   Allergies No Known Allergies   Home Medications  Prior to Admission medications   Medication Sig Start Date End Date Taking? Authorizing Provider  acetaminophen (TYLENOL) 325 MG tablet Take 650 mg by mouth every  6 (six) hours as needed.   Yes [provider]  acidophilus (RISAQUAD) CAPS capsule Take 1 capsule by mouth daily.   Yes [provider]  apixaban (ELIQUIS) 2.5 MG TABS tablet Take 1 tablet (2.5 mg total) by mouth 2 (two) times daily. 07/26/20  Yes Lavonia Drafts, MD  carbidopa-levodopa (SINEMET IR) 25-100 MG tablet Take 1 tablet by mouth in the morning and at bedtime. 09/21/21  Yes [provider]  famotidine (PEPCID) 10 MG tablet Take 10 mg by mouth at bedtime.   Yes [provider]  furosemide (LASIX) 20 MG tablet Take 1 tablet by mouth every other day. 09/29/21  Yes [provider]  loperamide (IMODIUM) 2 MG capsule Take 1 capsule (2 mg total) by mouth 2 (two) times daily as needed for diarrhea or loose stools. 06/06/20  Yes Dahal, Marlowe Aschoff, MD  loratadine (CLARITIN) 10 MG tablet Take 10 mg by mouth daily.   Yes [provider]  mirtazapine (REMERON) 7.5 MG tablet Take 7.5 mg by mouth at bedtime. 10/03/21  Yes [provider]  Multiple Vitamins-Minerals (MULTIVITAMIN PO) Take 1 tablet by mouth daily.   Yes [provider]  Olopatadine HCl (PATADAY) 0.7 % SOLN Place 1 drop into both eyes daily.   Yes [provider]  Polyethyl Glycol-Propyl Glycol (SYSTANE) 0.4-0.3 % SOLN Place 1 drop into both eyes in the morning and at bedtime.   Yes [provider]  propranolol ER (INDERAL LA) 60 MG 24 hr capsule Take 60 mg by mouth daily. 09/12/21  Yes [provider]  rosuvastatin (CRESTOR) 10 MG tablet Take 10 mg by mouth at bedtime. 09/25/21  Yes [provider]  simethicone (MYLICON) 80 MG chewable tablet Chew 80 mg by mouth 3 (three) times daily.   Yes [provider]  traMADol (ULTRAM) 50 MG tablet Take 50 mg by mouth 2 (two) times daily as needed. 07/12/21  Yes [provider]  Travoprost (TRAVATAN Z OP) Place 1 drop into both eyes at bedtime. One drop at bedtime in each eye   Yes [provider]  aspirin EC 81 MG tablet Take 81 mg by mouth daily. Swallow whole. Patient not taking: Reported on 10/09/2021  [provider]  ergocalciferol (VITAMIN D2) 1.25 MG (50000 UT) capsule Take 50,000 Units by mouth every Monday.    [provider]  feeding supplement (ENSURE ENLIVE / ENSURE PLUS) LIQD Take 237 mLs by mouth 2 (two) times daily between meals. 06/07/20   Terrilee Croak, MD  ferrous sulfate 325 (65 FE) MG tablet Take 325 mg by mouth 3 (three) times a week. Monday, Wednesday and friday Patient not taking: Reported on 09/18/2021    [provider]  fluticasone (FLONASE) 50 MCG/ACT nasal spray Place 1 spray into both nostrils daily. Patient not taking: Reported on 10/08/2021    [provider]  losartan (COZAAR) 50 MG tablet Take 1 tablet (50 mg total) by mouth daily. 06/06/20 07/26/20  Terrilee Croak, MD  metoprolol succinate (TOPROL-XL) 50 MG 24 hr tablet Take 50 mg by mouth daily. Patient not taking: Reported on 10/02/2021 03/21/20   [provider]  omeprazole (PRILOSEC) 40 MG capsule Take 40 mg by mouth daily. Patient not taking: Reported on 07/26/2020 03/21/20   [provider]  Phenylephrine-DM-GG (ROBITUSSIN CHILD COUGH/COLD CF) 2.05-19-48 MG/5ML LIQD Take 10 mLs by mouth every 6 (six) hours as needed. Patient not taking: Reported on 09/15/2021    [provider]  rifaximin (XIFAXAN) 550 MG TABS tablet Take 550 mg by mouth 3 (three) times daily. Patient not taking: Reported on 09/24/2021    [provider]   Scheduled Meds:  Chlorhexidine Gluconate Cloth  6 each Topical Q0600   mouth rinse  15 mL Mouth Rinse 4 times per day   Continuous Infusions:  sodium chloride     cefTRIAXone (ROCEPHIN)  IV Stopped (10/06/21 0542)   norepinephrine (LEVOPHED) Adult infusion 11 mcg/min (10/06/21 0728)   PRN Meds:.docusate, morphine injection, mouth rinse, polyethylene glycol   Active Hospital Problem list      Assessment & Plan:  Acute on Chronic Hypoxic Respiratory Failure secondary to CAP On chronic home 2 @3L  -Supplemental O2 as needed to maintain O2 saturations 88 to 92% -High risk for intubation -Follow intermittent ABG and chest x-ray as needed -As needed bronchodilators   Sepsis in the setting of suspected Pneumonia Meets SIRS Criteria  -Monitor fever curve -Trend WBC's & Procalcitonin -Follow cultures as above -Continue empiric abx pending cultures & sensitivities  Shock: suspect Cardiogenic +/- Septic NSTEMI Elevated Troponin Hypertension, A-fib, HLD  -Continuous cardiac monitoring -Maintain MAP >65 -Vasopressors as needed to maintain MAP goal -Trend lactic acid until normalized -Trend HS Troponin until peaked (232 ~ 332~ ) -BNP pending -Start Heparin gtt  -Hold home Lasix as blood pressure and renal function permits -Repeat 2D Echocardiogram   AKI likely ATN int he setting of above Hyperkalemia Lactic acidosis -Monitor I&O's / urinary output -Follow BMP -Ensure adequate renal perfusion -Avoid nephrotoxic agents as able -Replace electrolytes as indicated   Diabetes mellitus HgbA1c pending -CBG's AC & hs; Target range of 140 to 180 -SSI -Follow ICU Hypo/Hyperglycemia protocol  Chronic Diarrhea of unknown source KUB concerning for ileus vs SBO -NPO -NGT for decompression -Previously evaluated by GI on 11/07/20. Had Celiac Disease Panel , GI Profile, Stool, PCR,  Calprotectin, Fecal, Giardia, EIA; Ova/Parasite, C-Reactive Protein, Quant  which were all negative. -General Surgery consult  Anemia due to chronic illness - Ferritin - Iron Panel     Best practice:  Diet:  NPO Pain/Anxiety/Delirium protocol (if indicated): No VAP protocol (if indicated): Not indicated DVT prophylaxis: Subcutaneous Heparin GI prophylaxis: PPI Glucose control:  SSI Yes Central venous  access:  Yes, and it is still needed Arterial line:  N/A Foley:  Yes, and it is still  needed Mobility:  bed rest  PT consulted: N/A Last date of multidisciplinary goals of care discussion [10/02/2021] Code Status:  full code Disposition: ICU   = Goals of Care = Code Status Order: FULL  Primary Emergency Contact: Wal[place], Home Phone: [phone] Wishes to pursue full aggressive treatment and intervention options, including CPR and intubation, but goals of care will be addressed on going with family if that should become necessary.  Critical care provider statement:   Total critical care time: 33 minutes   Performed by: Lanney Gins MD   Critical care time was exclusive of separately billable procedures and treating other patients.   Critical care was necessary to treat or prevent imminent or life-threatening deterioration.   Critical care was time spent personally by me on the following activities: development of treatment plan with patient and/or surrogate as well as nursing, discussions with consultants, evaluation of patient's response to treatment, examination of patient, obtaining history from patient or surrogate, ordering and performing treatments and interventions, ordering and review of laboratory studies, ordering and review of radiographic studies, pulse oximetry and re-evaluation of patient's condition.    Ottie Glazier, M.D.  Pulmonary & Critical Care Medicine

## 2021-10-06 NOTE — Consult Note (Signed)
PHARMACY CONSULT NOTE  Pharmacy Consult for Electrolyte Monitoring and Replacement   Recent Labs: Potassium (mmol/L)  Date Value  10/06/2021 5.2 (H)   Magnesium (mg/dL)  Date Value  10/06/2021 2.4   Calcium (mg/dL)  Date Value  10/06/2021 7.1 (L)   Albumin (g/dL)  Date Value  09/20/2021 2.5 (L)   Phosphorus (mg/dL)  Date Value  10/06/2021 4.6   Sodium (mmol/L)  Date Value  10/06/2021 135   Assessment: Patient is a 88 y/o F with medical history including anemia of chronic disease, HTN, HLD, DM, vitamin B12 deficiency, chronic DVT on apixaban who presented from [facility] with SOB. Patient is now admitted with septic shock secondary to pneumonia. Pharmacy consulted to assist with electrolyte monitoring and replacement as indicated.  Scr trending up  Goal of Therapy:  Electrolytes within normal limits  Plan:  --Mild hyperkalemia in setting of renal dysfunction. Defer management to PCCM --Follow-up electrolytes with AM labs tomorrow  Ebony Torres 10/06/2021 7:31 AM

## 2021-10-06 NOTE — Progress Notes (Signed)
Nutrition Follow Up Note   DOCUMENTATION CODES:   Not applicable  INTERVENTION:   Once appropriate for tube feeds, recommend:  Osmolite 1.2'@55ml' /hr- Initiate at 21m/hr, once tolerating, increase by 178mhr q 8 hours until goal rate is reached.   Free water flushes 3073m4 hours to maintain tube patency   Regimen provides 1584m22m, 73g/day protein and 1262ml8m of free water.   Pt at high refeed risk  Vitamins zinc, copper, niacin, A and D pending   NUTRITION DIAGNOSIS:   Inadequate oral intake related to acute illness as evidenced by NPO status.  GOAL:   Patient will meet greater than or equal to 90% of their needs -not met   MONITOR:   Labs, Weight trends, Skin, I & O's, Diet advancement  ASSESSMENT:   89 y/59female with h/o DM, Afib, HTN, GERD, HLD, B12 deficiency, DVT and chronic diarrhea who is admitted with AKI, PNA, sepsis, fecal impaction and possible ileus/SBO.  Pt remains lethargic today. NGT in place with 550ml 85mut. Pt continues to have liquid bowel movements; some firm stool removed manually by RN. Plan today is for enema. Once fecal impaction resolves, recommend initiation of trickle tube feeds via NGT. Pt is at high refeed risk. Vitamin labs pending. Per chart, pt remains weight stable since admission. Pt +3.7L on her I & Os. Pt is noted to have some edema.   Medications reviewed and include: ceftriaxone, levophed  Labs reviewed: K 5.2(H), BUN 69(H), creat 2.95(H), P 4.6 wnl, Mg 2.4 wnl Wbc- 13.4(H), Hgb 8.4(L), Hct 24.7(L), MCV 69.0(L), 23.5(L) Cbgs- 82, 60, 70, 118, 48 x 24 hrs  Diet Order:    Diet Order             Diet NPO time specified  Diet effective now                  EDUCATION NEEDS:   No education needs have been identified at this time  Skin:  Skin Assessment: Reviewed RN Assessment (Stage II L & R buttocks)  Last BM:  9/22- type 6  Height:   Ht Readings from Last 1 Encounters:  10/12/2021 '5\' 2"'  (1.575 m)    Weight:    Wt Readings from Last 1 Encounters:  10/06/21 65.4 kg    Ideal Body Weight:  50 kg  BMI:  Body mass index is 26.37 kg/m.  Estimated Nutritional Needs:   Kcal:  1400-1600kcal/day  Protein:  70-80g/day  Fluid:  1.3-1.5L/day  Altagracia Rone Koleen DistanceD, LDN Please refer to [facility] and/or RD on-call/weekend/after hours pager

## 2021-10-06 NOTE — Progress Notes (Signed)
Hypoglycemic Event 11:32  CBG: 60  Treatment: D50 25 mL (12.5 gm) 11:47  Symptoms: None  Follow-up CBG: Time:12:09 CBG Result:82  Possible Reasons for Event: Inadequate meal intake  Comments/MD notified:Jeremiah Dewaine Conger, NP    Addison Naegeli

## 2021-10-07 MED ORDER — LORAZEPAM 2 MG/ML PO CONC: 1.0000 mg | ORAL | Status: DC | PRN

## 2021-10-07 MED ORDER — LORAZEPAM 2 MG/ML IJ SOLN
1.0000 mg | INTRAMUSCULAR | Status: DC | PRN
  Filled 2021-10-07: qty 1

## 2021-10-07 MED ORDER — LORAZEPAM 1 MG PO TABS: 1.0000 mg | ORAL_TABLET | ORAL | Status: DC | PRN

## 2021-10-08 LAB — CULTURE, BLOOD (ROUTINE X 2): Special Requests: ADEQUATE

## 2021-10-09 LAB — CULTURE, BLOOD (ROUTINE X 2)
Culture: NO GROWTH
Special Requests: ADEQUATE

## 2021-10-09 LAB — VITAMIN A: Vitamin A (Retinoic Acid): 13.9 ug/dL — ABNORMAL LOW (ref 22.0–69.5)

## 2021-10-11 LAB — COPPER, SERUM: Copper: 103 ug/dL (ref 80–158)

## 2021-10-11 LAB — ZINC: Zinc: 36 ug/dL — ABNORMAL LOW (ref 44–115)

## 2021-10-13 LAB — MISC LABCORP TEST (SEND OUT): Labcorp test code: 70115

## 2021-10-15 NOTE — Progress Notes (Signed)
I responded to a page from the nurse to provide spiritual support for the patient's family. I arrived at the patient's room where her son and granddaughter were present. I provided spiritual support through pastoral presence, by reading scripture, and by leading in prayer.    09/19/2021 0225  Clinical Encounter Type  Visited With Patient and family together  Visit Type Spiritual support;Patient actively dying  Referral From Nurse  Consult/Referral To Chaplain  Spiritual Encounters  Spiritual Needs [facility] text;Prayer    Chaplain Dr Redgie Grayer

## 2021-10-15 NOTE — Progress Notes (Signed)
Patient is currently transitioning with family at the bedside. Morphine dosage increased for comfort due to patient constant moaning. Morphine administered Q1H since 2000 without success. Currently unable to obtain patients O2 Sat & HR changing from ST to [facility]. Benjamine Mola, NP notified and spoke with granddaughter. Levophed stopped at this time.

## 2021-10-15 NOTE — Progress Notes (Signed)
Patient expired @ 0212 with family at bedside.  [facility] notified. Patient is not a candidate for donation due to her age. [facility] number [phone]. Family will contact facility with funeral home information.

## 2021-10-15 NOTE — Death Summary Note (Signed)
DEATH SUMMARY   Patient Details  Name: Ebony Torres MRN: [id number] DOB: 1930/07/08  Admission/Discharge Information   Admit Date:  2021-10-24  Date of Death:  26-Oct-2021  Time of Death: 0212   Length of Stay: 0  Referring Physician: Idelle Crouch, MD   Reason(s) for Hospitalization  Severe sepsis with shock  Diagnoses  Preliminary cause of death: Severe sepsis with shock secondary to Ecoli bacteremia , pneumonia and multiorgan failure Secondary Diagnoses (including complications and co-morbidities):  Active Problems:   * No active hospital problems. * Acute on Chronic Hypoxic Respiratory Failure secondary to CAP Severe sepsis with shock secondary to pneumonia and Ecoli bacteremia AKI Chronic diarrhea due to Ileus [facility] Course (including significant findings, care, treatment, and services provided and events leading to death)  Ebony Torres is a 88 y.o. year old female significant PMH  of Anemia of Chronic disease, HTN, HLD, T2DM, Vit B12 deficiency, chronic DVT on chronic anticoagulation with Eliquis who presented to the ED from [facility] with chief complaints of SOB.    Per ED reports, EMS was called out due to breathing difficulties. Initial sats  were between 60-70% and placed on 15L NRB sats up to 100%. Pt normally on 3L [place] at baseline  Pt was also hypotensive  with BP readings 70/40 and given 250cc NS with rebound pressure 83/50. Per patient's son, patient also has been having diarrhea for the past 5-6 months.   ED Course: In the emergency department, vital signs showed blood pressure of 93/35 dropped to 65/50 mm Hg, HR of 76 beats per minute, a respiratory rate of 25 breaths per minute, and a temperature of 97.86F (36.6C).  SATS 99% on 5L.   Pertinent Labs/Diagnostics Findings: Chemistry:Na+/ K+:138/5.2  Glucose:174  BUN/Cr.: 61/2.55  AST/ALT: CBC: WBC:16.1 Hgb/Hct: 8.6/27.2 Other Lab findings:   PCT: pending, Lactic acid: 2.6 ~4.7 COVID PCR:  Negative, Troponin:232 ~332  Imaging:  CXR> Left lower lobe consolidation which may reflect atelectasis or Pneumonia. CTH> No acute intracranial abnormality. 2. Small right frontal meningioma measuring 11 x 6 x 12 mm. No mass effect   Patient given 30 cc/kg of fluids and started on broad-spectrum antibiotics Vanco cefepime and Flagyl for sepsis with septic shock. Patient remained hypotensive despite IVF boluses therefore was started on Levophed. PCCM consulted.  Hospital Course:Admitted to ICU with sepsis  with shock secondary to suspected pneumonia, AND probabale UTI. Due to persistent chronic diarrhea and abdominal distension, KUB was obtained and patient found to have ileus vs SBO with significant stool burden.  On 10/06/21, CT showed high stool burden with suspected Ileus vs SBO. Patient also with worsening AKI, she had nephrology evaluation family DID not wish for HD, she is DNR with severe comorbidities, remained in circulatory shock on levophed vasopressor oliguric from foley despite aggressive IVF. Given severe Ecoli bacteremia and worsening multiorgan failure, family decided to pursue comfort care measures. Patient was transitioned to comfort care measures only on Oct 26, 2021 and died shortly at 0212 woth granddaughter at bedside.  Pertinent Labs and Studies  Significant Diagnostic Studies ECHOCARDIOGRAM COMPLETE  Result Date: Oct 24, 2021    ECHOCARDIOGRAM REPORT   Patient Name:   Ebony Torres Date of Exam: October 24, 2021 Medical Rec #:  [id number]      Height:       62.0 in Accession #:    [id number]     Weight:       140.0 lb Date of Birth:  09-Aug-1930  BSA:          1.643 m Patient Age:    88 years       BP:           85/64 mmHg Patient Gender: F              HR:           89 bpm. Exam Location:  [facility] Procedure: 2D Echo, Color Doppler and Cardiac Doppler Indications:     I50.31 congestive heart failure-Acute Diastolic  History:         Patient has no prior history of Echocardiogram examinations.                   Risk Factors:Hypertension and Diabetes.  Sonographer:     Charmayne Sheer Referring Phys:  [place] Diagnosing Phys: Ida Rogue MD  Sonographer Comments: Suboptimal apical window and suboptimal subcostal window. Image acquisition challenging due to respiratory motion. IMPRESSIONS  1. Left ventricular ejection fraction, by estimation, is 55 to 60%. The left ventricle has normal function. The left ventricle has no regional wall motion abnormalities. There is mild left ventricular hypertrophy. Left ventricular diastolic parameters are consistent with Grade I diastolic dysfunction (impaired relaxation).  2. Right ventricular systolic function is normal. The right ventricular size is normal. There is normal pulmonary artery systolic pressure. The estimated right ventricular systolic pressure is 82.4 mmHg.  3. The mitral valve is normal in structure. No evidence of mitral valve regurgitation. No evidence of mitral stenosis.  4. Tricuspid valve regurgitation is mild to moderate.  5. The aortic valve is tricuspid. There is mild calcification of the aortic valve. Aortic valve regurgitation is not visualized. Aortic valve sclerosis/calcification is present, without any evidence of aortic stenosis.  6. The inferior vena cava is normal in size with greater than 50% respiratory variability, suggesting right atrial pressure of 3 mmHg. FINDINGS  Left Ventricle: Left ventricular ejection fraction, by estimation, is 55 to 60%. The left ventricle has normal function. The left ventricle has no regional wall motion abnormalities. The left ventricular internal cavity size was normal in size. There is  mild left ventricular hypertrophy. Left ventricular diastolic parameters are consistent with Grade I diastolic dysfunction (impaired relaxation). Right Ventricle: The right ventricular size is normal. No increase in right ventricular wall thickness. Right ventricular systolic function is normal. There is  normal pulmonary artery systolic pressure. The tricuspid regurgitant velocity is 2.33 m/s, and  with an assumed right atrial pressure of 5 mmHg, the estimated right ventricular systolic pressure is 23.5 mmHg. Left Atrium: Left atrial size was normal in size. Right Atrium: Right atrial size was normal in size. Pericardium: There is no evidence of pericardial effusion. Mitral Valve: The mitral valve is normal in structure. Mild mitral annular calcification. No evidence of mitral valve regurgitation. No evidence of mitral valve stenosis. Tricuspid Valve: The tricuspid valve is normal in structure. Tricuspid valve regurgitation is mild to moderate. No evidence of tricuspid stenosis. Aortic Valve: The aortic valve is tricuspid. There is mild calcification of the aortic valve. Aortic valve regurgitation is not visualized. Aortic valve sclerosis/calcification is present, without any evidence of aortic stenosis. Aortic valve mean gradient measures 4.0 mmHg. Aortic valve peak gradient measures 7.0 mmHg. Aortic valve area, by VTI measures 1.83 cm. Pulmonic Valve: The pulmonic valve was normal in structure. Pulmonic valve regurgitation is mild. No evidence of pulmonic stenosis. Aorta: The aortic root is normal in size and structure. Venous: The inferior  vena cava is normal in size with greater than 50% respiratory variability, suggesting right atrial pressure of 3 mmHg. IAS/Shunts: No atrial level shunt detected by color flow Doppler.  LEFT VENTRICLE PLAX 2D LVIDd:         3.30 cm   Diastology LVIDs:         2.30 cm   LV e' medial:    7.51 cm/s LV PW:         0.90 cm   LV E/e' medial:  8.4 LV IVS:        1.20 cm   LV e' lateral:   7.83 cm/s LVOT diam:     1.90 cm   LV E/e' lateral: 8.0 LV SV:         34 LV SV Index:   21 LVOT Area:     2.84 cm  RIGHT VENTRICLE RV Basal diam:  3.50 cm RV Mid diam:    3.00 cm RV S prime:     12.70 cm/s LEFT ATRIUM           Index        RIGHT ATRIUM           Index LA diam:      2.50 cm 1.52  cm/m   RA Area:     10.80 cm LA Vol (A2C): 24.8 ml 15.10 ml/m  RA Volume:   23.40 ml  14.24 ml/m LA Vol (A4C): 30.1 ml 18.32 ml/m  AORTIC VALVE                    PULMONIC VALVE AV Area (Vmax):    1.56 cm     PV Vmax:       0.76 m/s AV Area (Vmean):   1.52 cm     PV Peak grad:  2.3 mmHg AV Area (VTI):     1.83 cm AV Vmax:           132.00 cm/s AV Vmean:          91.800 cm/s AV VTI:            0.186 m AV Peak Grad:      7.0 mmHg AV Mean Grad:      4.0 mmHg LVOT Vmax:         72.80 cm/s LVOT Vmean:        49.300 cm/s LVOT VTI:          0.120 m LVOT/AV VTI ratio: 0.65  AORTA Ao Root diam: 3.30 cm MITRAL VALVE                TRICUSPID VALVE MV Area (PHT): 9.60 cm     TR Peak grad:   21.7 mmHg MV Decel Time: 79 msec      TR Vmax:        233.00 cm/s MV E velocity: 62.90 cm/s MV A velocity: 118.00 cm/s  SHUNTS MV E/A ratio:  0.53         Systemic VTI:  0.12 m                             Systemic Diam: 1.90 cm Ida Rogue MD Electronically signed by Ida Rogue MD Signature Date/Time: 09/26/2021/4:45:46 PM    Final    CT ABDOMEN PELVIS WO CONTRAST  Result Date: 09/22/2021 CLINICAL DATA:  Abdominal pain EXAM: CT ABDOMEN AND PELVIS WITHOUT CONTRAST TECHNIQUE: Multidetector CT imaging of the abdomen and  pelvis was performed following the standard protocol without IV contrast. RADIATION DOSE REDUCTION: This exam was performed according to the departmental dose-optimization program which includes automated exposure control, adjustment of the mA and/or kV according to patient size and/or use of iterative reconstruction technique. COMPARISON:  Abdominal radiographs done earlier today FINDINGS: Lower chest: There are patchy infiltrates in both lower lung fields. Heart is enlarged in size. Dense calcification is seen in mitral and aortic annulus. Scattered coronary artery calcifications are seen. Possible minimal right pleural effusion is seen. Hepatobiliary: No focal abnormalities are seen in liver. There is no  dilation of bile ducts. Gallbladder is not distended. Pancreas: There is atrophy in the pancreas. No focal abnormalities are seen. Spleen: Spleen is smaller than usual in size. Adrenals/Urinary Tract: There is hyperplasia of left adrenal. There is no hydronephrosis. There are multiple smooth marginated lesions of varying sizes in both kidneys. Many of these lesions appear hyperdense. There are no renal or ureteral stones. Foley catheter is seen in the bladder. Stomach/Bowel: There is contrast in the lumen of lower thoracic esophagus suggesting possible gastroesophageal reflux. Stomach is distended. There is contrast seen proximal jejunum. There is dilation of proximal small bowel loops measuring up to 3.4 cm in diameter. Appendix is not seen. There is large amount of stool in rectosigmoid. Transverse diameter of rectum measures up to 9.2 cm. Sigmoid colon measures up to 12.5 cm in transverse diameter. There is diffuse wall thickening in rectosigmoid. There is mild stranding in the perirectal region. Vascular/Lymphatic: There are coarse calcifications in abdominal aorta and its major branches. Reproductive: Uterus is not seen. Other: There is no pneumoperitoneum. There is no significant ascites There is subcutaneous edema in the abdominal wall and gluteal regions. Musculoskeletal: No acute findings are seen. IMPRESSION: There is large amount of stool in rectosigmoid. There is diffuse wall thickening in rectosigmoid. Findings suggest fecal impaction and stercoral colitis. There is mild to moderate dilation of proximal small bowel loops without demonstrable zone of transition, possibly suggesting ileus or partial small bowel obstruction due to adhesions. There is no hydronephrosis. There are multiple smooth marginated lesions in both kidneys many of which show high attenuation suggesting possible hemorrhagic cysts. There is edema in abdominal wall and gluteal regions in subcutaneous plane suggesting anasarca. There are  patchy infiltrates in both lower lung fields suggesting atelectasis/pneumonia. Possible gastroesophageal reflux. Other findings as described in the body of the report. Electronically Signed   By: Elmer Picker M.D.   On: 09/24/2021 12:28   DG Chest 1 View  Result Date: 10/02/2021 CLINICAL DATA:  Central line placement. EXAM: CHEST  1 VIEW COMPARISON:  10/14/2021 FINDINGS: 0622 hours. Low volume film. The cardio pericardial silhouette is enlarged. Bibasilar atelectasis noted. Right IJ central line is new in the interval with tip overlying the low right atrium. No pneumothorax or pleural effusion. NG tube tube is new with the tip in the gastric antrum. Proximal side port is below the GE junction. Telemetry leads overlie the chest. IMPRESSION: 1. New right IJ central line tip is in the low right atrium. No pneumothorax or pleural effusion. 2. NG tube tip is in the gastric fundus. 3. Cardiomegaly with bibasilar atelectasis. Electronically Signed   By: Misty Stanley M.D.   On: 09/22/2021 07:11   DG Abd 1 View  Result Date: 10/02/2021 CLINICAL DATA:  Orogastric tube placement EXAM: ABDOMEN - 1 VIEW COMPARISON:  Earlier today FINDINGS: Enteric tube with tip and side port at the stomach. Gas dilated bowel  diffusely. Stool distended bowel loops over the lower abdomen. No concerning mass effect or gas collection. IMPRESSION: 1. Enteric tube with tip and side port at the decompressed stomach. 2. Otherwise unchanged bowel gas pattern with rectal stool distention. Electronically Signed   By: Jorje Guild M.D.   On: 10/04/2021 05:23   DG Abd 1 View  Result Date: 10/13/2021 CLINICAL DATA:  Abdominal distension EXAM: ABDOMEN - 1 VIEW COMPARISON:  None Available. FINDINGS: Scattered large and small bowel gas is noted. The small bowel is at the upper limits of normal in size. Findings consistent with rectal impaction are seen. No free air is noted. IMPRESSION: Changes of rectal impaction as well as mildly  prominent small bowel which may represent an ileus or early partial small bowel obstruction. Electronically Signed   By: Inez Catalina M.D.   On: 09/23/2021 02:57   DG Chest 1 View  Result Date: 09/28/2021 CLINICAL DATA:  Shortness of breath EXAM: PORTABLE CHEST 1 VIEW COMPARISON:  10/06/2021 FINDINGS: Cardiac shadow is mildly enlarged but stable. Improved aeration is noted in the left base with only minimal residual atelectasis identified. No new focal infiltrate is seen. No bony abnormality is noted. IMPRESSION: Improved aeration in the left base. Electronically Signed   By: Inez Catalina M.D.   On: 09/21/2021 02:57   CT Head Wo Contrast  Result Date: 09/26/2021 CLINICAL DATA:  Altered mental status. EXAM: CT HEAD WITHOUT CONTRAST TECHNIQUE: Contiguous axial images were obtained from the base of the skull through the vertex without intravenous contrast. RADIATION DOSE REDUCTION: This exam was performed according to the departmental dose-optimization program which includes automated exposure control, adjustment of the mA and/or kV according to patient size and/or use of iterative reconstruction technique. COMPARISON:  None Available. FINDINGS: Brain: No acute intracranial hemorrhage. No evidence of acute ischemia. Brain volume is normal for age. Mild periventricular and deep white matter hypodensity is nonspecific but typical of chronic small vessel ischemia. In the anterior para fall seen frontal lobe to the right of midline is an 11 x 6 x 12 mm partially calcified lesion consistent with meningioma. There is no associated mass effect. No midline shift. No subdural collection. Vascular: Atherosclerosis of skullbase vasculature without hyperdense vessel or abnormal calcification. Skull: No fracture or focal lesion. Sinuses/Orbits: No acute finding. Other: None. IMPRESSION: 1. No acute intracranial abnormality. 2. Small right frontal meningioma measuring 11 x 6 x 12 mm. No mass effect. This is likely incidental  and of doubtful clinical significance, however there are no prior exams available to assess for stability. As clinically indicated, MRI could be considered for characterization. 3. Mild chronic small vessel ischemia. Electronically Signed   By: Keith Rake M.D.   On: 09/22/2021 21:04   DG Chest Portable 1 View  Result Date: 10/06/2021 CLINICAL DATA:  Oxygen desaturation, hypotension, short of breath EXAM: PORTABLE CHEST 1 VIEW COMPARISON:  None Available. FINDINGS: Single frontal view of the chest demonstrates enlarged cardiac silhouette. Increased retrocardiac density consistent with airspace disease or atelectasis. No large effusion or pneumothorax. No acute bony abnormalities. IMPRESSION: 1. Left lower lobe consolidation which may reflect atelectasis or pneumonia. Electronically Signed   By: Randa Ngo M.D.   On: 10/03/2021 20:08    Microbiology Recent Results (from the past 240 hour(s))  SARS Coronavirus 2 by RT PCR (hospital order, performed in [facility] hospital lab) *cepheid single result test* Anterior Nasal Swab     Status: None   Collection Time: 10/14/2021  8:11 PM  Specimen: Anterior Nasal Swab  Result Value Ref Range Status   SARS Coronavirus 2 by RT PCR NEGATIVE NEGATIVE Final    Comment: (NOTE) SARS-CoV-2 target nucleic acids are NOT DETECTED.  The SARS-CoV-2 RNA is generally detectable in upper and lower respiratory specimens during the acute phase of infection. The lowest concentration of SARS-CoV-2 viral copies this assay can detect is 250 copies / mL. A negative result does not preclude SARS-CoV-2 infection and should not be used as the sole basis for treatment or other patient management decisions.  A negative result may occur with improper specimen collection / handling, submission of specimen other than nasopharyngeal swab, presence of viral mutation(s) within the areas targeted by this assay, and inadequate number of viral copies (<250 copies / mL). A  negative result must be combined with clinical observations, patient history, and epidemiological information.  Fact Sheet for Patients:   https://www.patel.info/  Fact Sheet for Healthcare Providers: https://hall.com/  This test is not yet approved or  cleared by the [place] FDA and has been authorized for detection and/or diagnosis of SARS-CoV-2 by FDA under an Emergency Use Authorization (EUA).  This EUA will remain in effect (meaning this test can be used) for the duration of the COVID-19 declaration under Section 564(b)(1) of the Act, 21 U.S.C. section 360bbb-3(b)(1), unless the authorization is terminated or revoked sooner.  Performed at [facility], [address] [place], [place] [address]   Blood culture (routine x 2)     Status: None (Preliminary result)   Collection Time: 10/11/2021  8:37 PM   Specimen: BLOOD  Result Value Ref Range Status   Specimen Description BLOOD BLOOD RIGHT ARM  Final   Special Requests   Final    BOTTLES DRAWN AEROBIC AND ANAEROBIC Blood Culture adequate volume   Culture   Final    NO GROWTH 3 DAYS Performed at [facility], [address], [place], [place] [address]    Report Status PENDING  Incomplete  Blood culture (routine x 2)     Status: Abnormal (Preliminary result)   Collection Time: 09/25/2021  8:37 PM   Specimen: BLOOD  Result Value Ref Range Status   Specimen Description   Final    BLOOD BLOOD RIGHT ARM Performed at [facility], [address] [place], [place] [address]    Special Requests   Final    BOTTLES DRAWN AEROBIC AND ANAEROBIC Blood Culture adequate volume Performed at [facility], [address], [place], [place] [address]    Culture  Setup Time   Final    GRAM NEGATIVE RODS ANAEROBIC BOTTLE ONLY CRITICAL RESULT CALLED TO, READ BACK BY AND VERIFIED WITH: NATHAN BELEUE @2236  ON 09/16/2021 SKL    Culture (A)  Final    ESCHERICHIA  COLI SUSCEPTIBILITIES TO FOLLOW Performed at [facility], [place] [address] [place],  [address]    Report Status PENDING  Incomplete  Blood Culture ID Panel (Reflexed)     Status: Abnormal   Collection Time: 09/24/2021  8:37 PM  Result Value Ref Range Status   Enterococcus faecalis NOT DETECTED NOT DETECTED Final   Enterococcus Faecium NOT DETECTED NOT DETECTED Final   Listeria monocytogenes NOT DETECTED NOT DETECTED Final   Staphylococcus species NOT DETECTED NOT DETECTED Final   Staphylococcus aureus (BCID) NOT DETECTED NOT DETECTED Final   Staphylococcus epidermidis NOT DETECTED NOT DETECTED Final   Staphylococcus lugdunensis NOT DETECTED NOT DETECTED Final   Streptococcus species NOT DETECTED NOT  DETECTED Final   Streptococcus agalactiae NOT DETECTED NOT DETECTED Final   Streptococcus pneumoniae NOT DETECTED NOT DETECTED Final   Streptococcus pyogenes NOT DETECTED NOT DETECTED Final   A.calcoaceticus-baumannii NOT DETECTED NOT DETECTED Final   Bacteroides fragilis NOT DETECTED NOT DETECTED Final   Enterobacterales DETECTED (A) NOT DETECTED Final    Comment: Enterobacterales represent a large order of gram negative bacteria, not a single organism. CRITICAL RESULT CALLED TO, READ BACK BY AND VERIFIED WITH: NATHAN BELEUE @2236  ON 09/26/2021 SKL    Enterobacter cloacae complex NOT DETECTED NOT DETECTED Final   Escherichia coli DETECTED (A) NOT DETECTED Final    Comment: CRITICAL RESULT CALLED TO, READ BACK BY AND VERIFIED WITH: NATHAN BELEUE @2236  ON 10/08/2021 SKL    Klebsiella aerogenes NOT DETECTED NOT DETECTED Final   Klebsiella oxytoca NOT DETECTED NOT DETECTED Final   Klebsiella pneumoniae NOT DETECTED NOT DETECTED Final   Proteus species NOT DETECTED NOT DETECTED Final   Salmonella species NOT DETECTED NOT DETECTED Final   Serratia marcescens NOT DETECTED NOT DETECTED Final   Haemophilus influenzae NOT DETECTED NOT DETECTED Final   Neisseria meningitidis NOT DETECTED  NOT DETECTED Final   Pseudomonas aeruginosa NOT DETECTED NOT DETECTED Final   Stenotrophomonas maltophilia NOT DETECTED NOT DETECTED Final   Candida albicans NOT DETECTED NOT DETECTED Final   Candida auris NOT DETECTED NOT DETECTED Final   Candida glabrata NOT DETECTED NOT DETECTED Final   Candida krusei NOT DETECTED NOT DETECTED Final   Candida parapsilosis NOT DETECTED NOT DETECTED Final   Candida tropicalis NOT DETECTED NOT DETECTED Final   Cryptococcus neoformans/gattii NOT DETECTED NOT DETECTED Final   CTX-M ESBL NOT DETECTED NOT DETECTED Final   Carbapenem resistance IMP NOT DETECTED NOT DETECTED Final   Carbapenem resistance KPC NOT DETECTED NOT DETECTED Final   Carbapenem resistance NDM NOT DETECTED NOT DETECTED Final   Carbapenem resist OXA 48 LIKE NOT DETECTED NOT DETECTED Final   Carbapenem resistance VIM NOT DETECTED NOT DETECTED Final    Comment: Performed at [facility], [address] [place], [place] [address]  MRSA Next Gen by PCR, Nasal     Status: None   Collection Time: 09/29/2021  1:59 AM   Specimen: Nasal Mucosa; Nasal Swab  Result Value Ref Range Status   MRSA by PCR Next Gen NOT DETECTED NOT DETECTED Final    Comment: (NOTE) The GeneXpert MRSA Assay (FDA approved for NASAL specimens only), is one component of a comprehensive MRSA colonization surveillance program. It is not intended to diagnose MRSA infection nor to guide or monitor treatment for MRSA infections. Test performance is not FDA approved in patients less than 34 years old. Performed at [facility], [address], [place], [place] [address]   Urine Culture     Status: None   Collection Time: 10/03/2021  4:10 AM   Specimen: Urine, Random  Result Value Ref Range Status   Specimen Description   Final    URINE, RANDOM Performed at [facility], [address] [place], [place] [address]    Special Requests   Final    NONE Performed at [facility], [address], [place], [place] [address]    Culture   Final    NO GROWTH Performed at [facility], [place] [address] [place], [place] [address]    Report Status 10/06/2021 FINAL  Final  C Difficile Quick Screen w PCR reflex     Status: None  Collection Time: 10/04/2021  8:20 AM   Specimen: STOOL  Result Value Ref Range Status   C Diff antigen NEGATIVE NEGATIVE Final   C Diff toxin NEGATIVE NEGATIVE Final   C Diff interpretation No C. difficile detected.  Final    Comment: Performed at [facility], [address] [place], [place] [address]  Gastrointestinal Panel by PCR , Stool     Status: None   Collection Time: 10/09/2021  8:20 AM   Specimen: Stool  Result Value Ref Range Status   Campylobacter species NOT DETECTED NOT DETECTED Final   Plesimonas shigelloides NOT DETECTED NOT DETECTED Final   Salmonella species NOT DETECTED NOT DETECTED Final   Yersinia enterocolitica NOT DETECTED NOT DETECTED Final   Vibrio species NOT DETECTED NOT DETECTED Final   Vibrio cholerae NOT DETECTED NOT DETECTED Final   Enteroaggregative E coli (EAEC) NOT DETECTED NOT DETECTED Final   Enteropathogenic E coli (EPEC) NOT DETECTED NOT DETECTED Final   Enterotoxigenic E coli (ETEC) NOT DETECTED NOT DETECTED Final   Shiga like toxin producing E coli (STEC) NOT DETECTED NOT DETECTED Final   Shigella/Enteroinvasive E coli (EIEC) NOT DETECTED NOT DETECTED Final   Cryptosporidium NOT DETECTED NOT DETECTED Final   Cyclospora cayetanensis NOT DETECTED NOT DETECTED Final   Entamoeba histolytica NOT DETECTED NOT DETECTED Final   Giardia lamblia NOT DETECTED NOT DETECTED Final   Adenovirus F40/41 NOT DETECTED NOT DETECTED Final   Astrovirus NOT DETECTED NOT DETECTED Final   Norovirus GI/GII NOT DETECTED NOT DETECTED Final   Rotavirus A NOT DETECTED NOT DETECTED Final   Sapovirus (I, II, IV, and V) NOT DETECTED NOT DETECTED Final    Comment: Performed at [facility], [address]  [place],  [address]    Lab Basic Metabolic Panel: Recent Labs  Lab 09/26/2021 2305 09/27/2021 0505 10/06/2021 1429 09/25/2021 1955 10/06/21 0521  NA 138 136 131* 134* 135  K 5.2* 5.0 5.5* 5.1 5.2*  CL 109 108 102 102 104  CO2 17* 17* 17* 20* 20*  GLUCOSE 174* 172* 110* 99 123*  BUN 61* 60* 61* 64* 69*  CREATININE 2.55* 2.53* 2.61* 2.74* 2.95*  CALCIUM 8.1* 7.9* 7.5* 7.3* 7.1*  MG  --  2.2  --   --  2.4  PHOS  --  5.2*  --   --  4.6   Liver Function Tests: Recent Labs  Lab 09/23/2021 0505  AST 39  ALT 12  ALKPHOS 78  BILITOT 1.0  PROT 5.8*  ALBUMIN 2.5*   No results for input(s): "LIPASE", "AMYLASE" in the last 168 hours. No results for input(s): "AMMONIA" in the last 168 hours. CBC: Recent Labs  Lab 09/20/2021 1939 09/24/2021 0505 10/06/21 0521  WBC 16.1* 6.9 13.4*  HGB 8.6* 8.5* 8.4*  HCT 27.2* 25.7* 24.7*  MCV 74.7* 71.8* 69.0*  PLT 362 349 313   Cardiac Enzymes: No results for input(s): "CKTOTAL", "CKMB", "CKMBINDEX", "TROPONINI" in the last 168 hours. Sepsis Labs: Recent Labs  Lab 09/24/2021 1939 10/06/2021 2011 10/04/21 2216 10/03/2021 0505 10/06/21 0521  PROCALCITON  --   --   --  21.39 29.30  WBC 16.1*  --   --  6.9 13.4*  LATICACIDVEN  --  2.6* 4.7* 1.6  --     Procedures/Operations  09/21: [place]  Rufina Falco, DNP, CCRN, FNP-C, AGACNP-BC Acute Care & Family Nurse Practitioner  [facility]  See Amion for personal pager PCCM on call pager [phone]  until 7 am

## 2021-10-15 DEATH — deceased

## 2023-05-17 IMAGING — US US EXTREM LOW VENOUS
1 series · 13 of 24 positions shown · non-contrast
Comparison: None.
COMPARISON: None.

Addendum:
CLINICAL DATA: Bilateral leg pain

EXAM:
Bilateral LOWER EXTREMITY VENOUS DOPPLER ULTRASOUND
TECHNIQUE: Gray-scale sonography with compression, as well as color and duplex
ultrasound, were performed to evaluate the deep venous system(s)
from the level of the common femoral vein through the popliteal and
proximal calf veins.

[Series 1: us venous img lower bilat (dvt) · portal-venous · 13 of 61 slices shown]
[im 1/61]
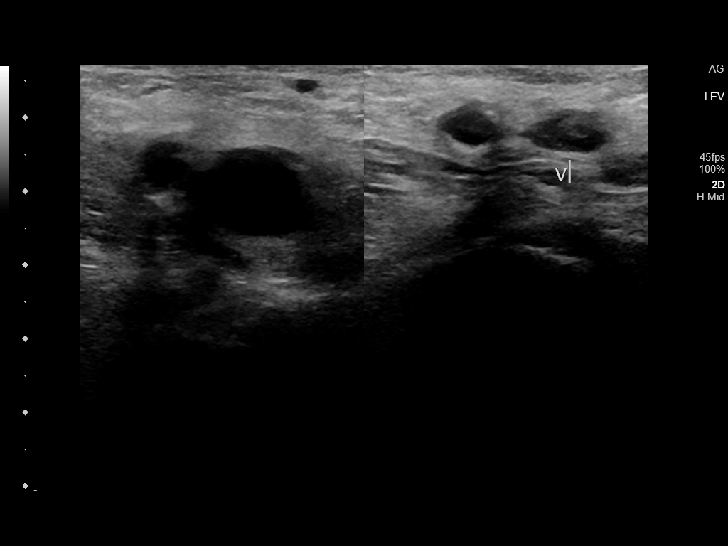
[im 6/61]
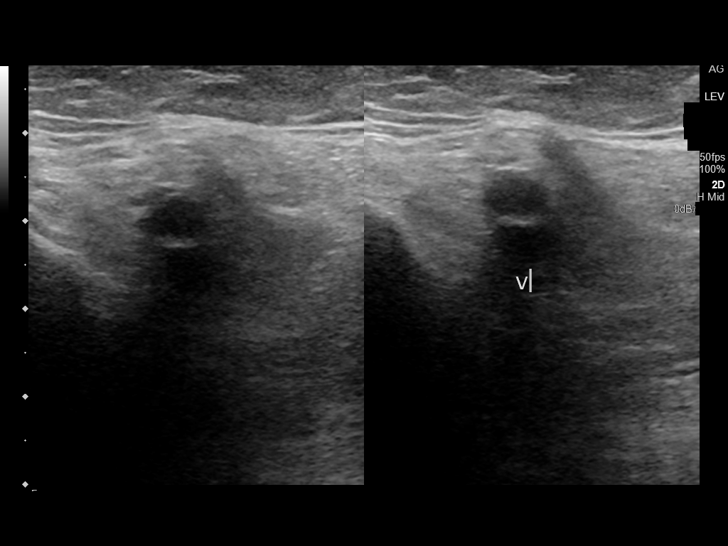
[im 11/61]
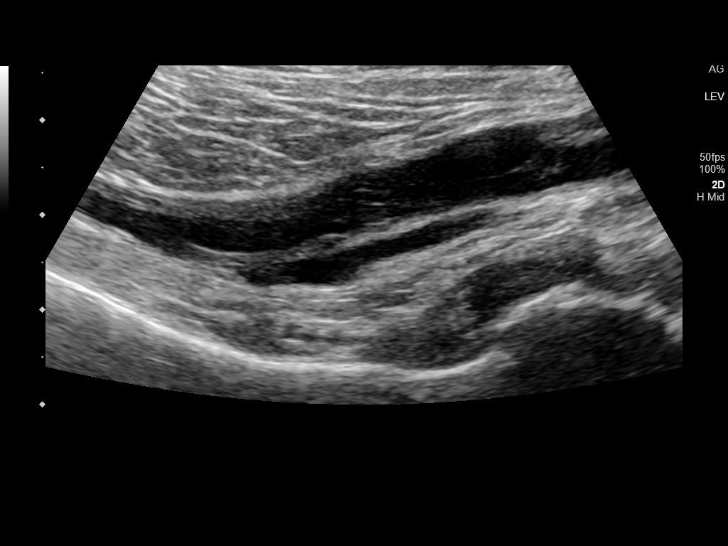
[im 16/61]
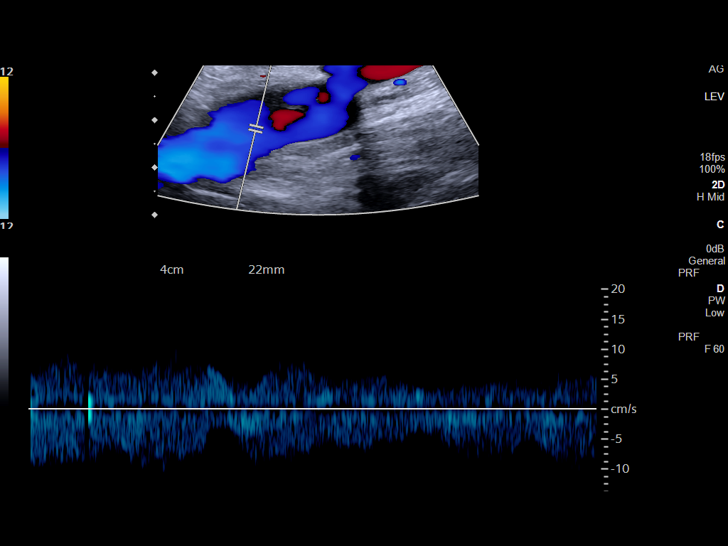
[im 21/61]
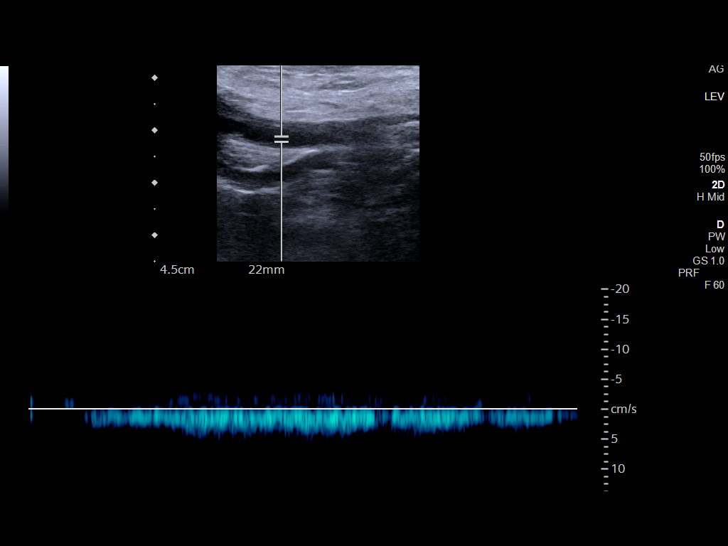
[im 27/61]
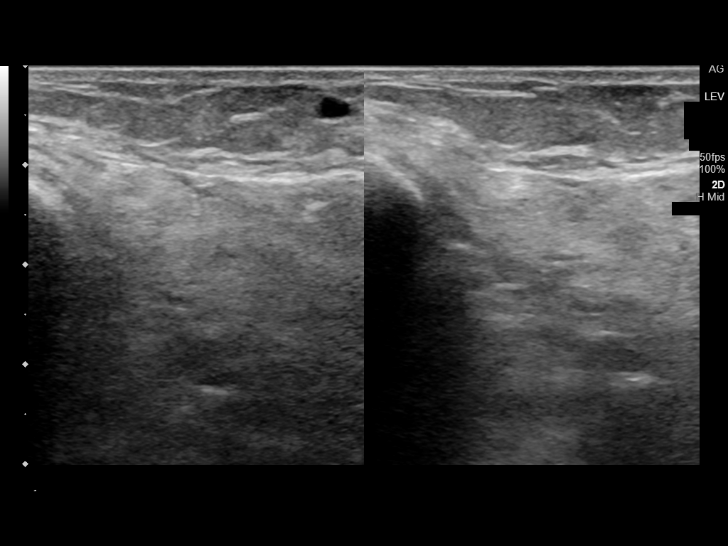
[im 32/61]
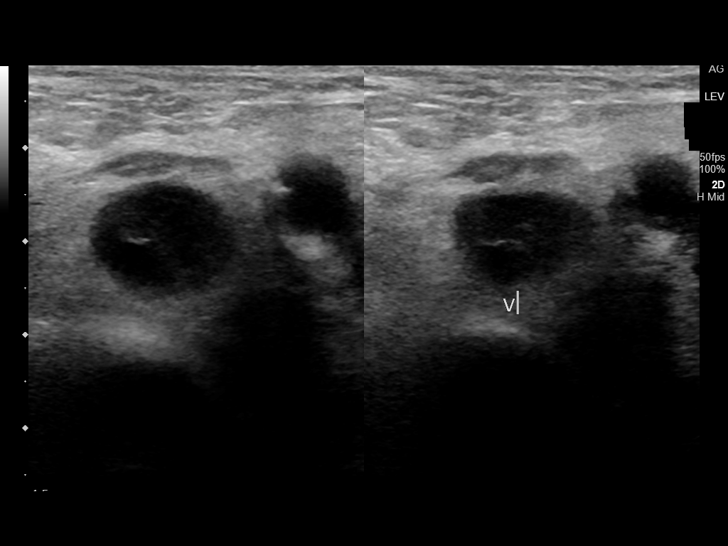
[im 34/61]
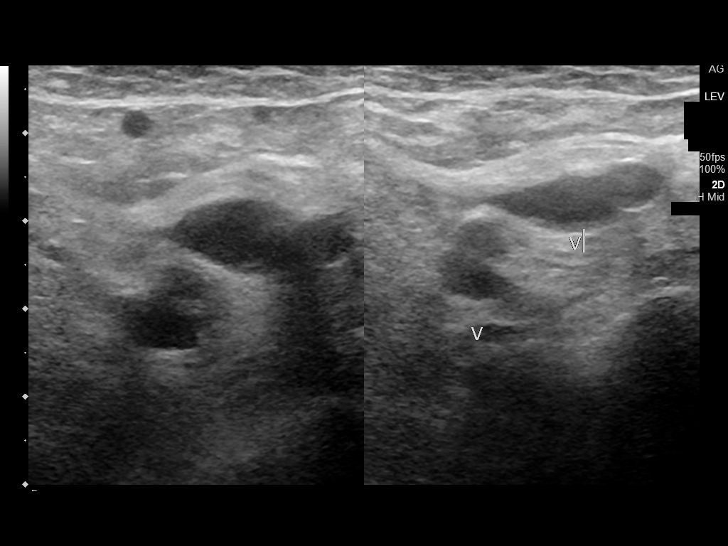
[im 40/61]
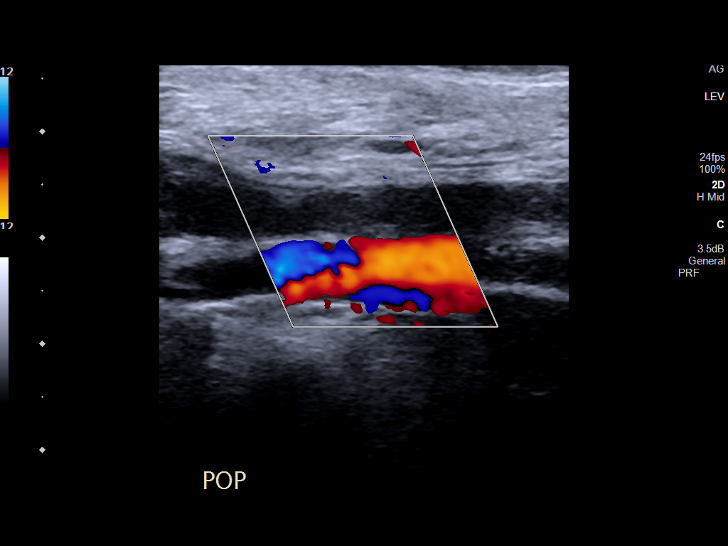
[im 45/61]
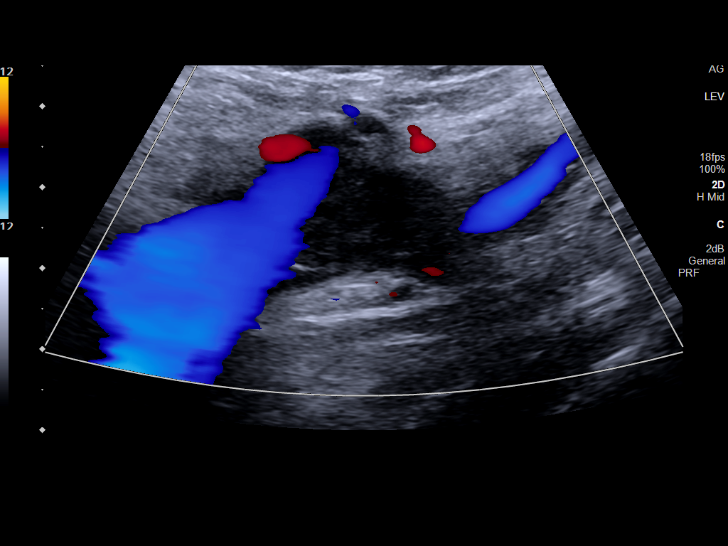
[im 50/61]
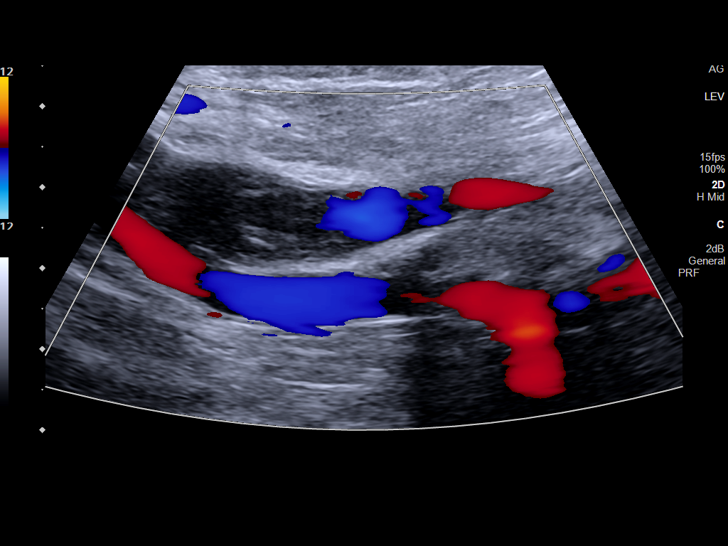
[im 55/61]
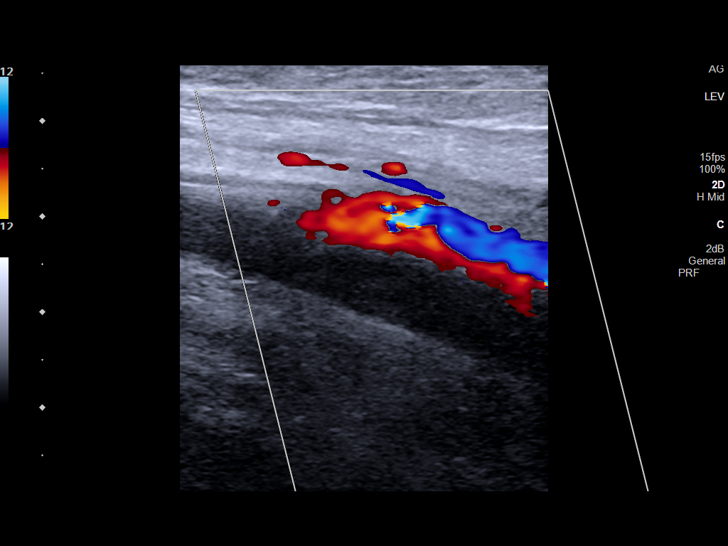
[im 61/61]
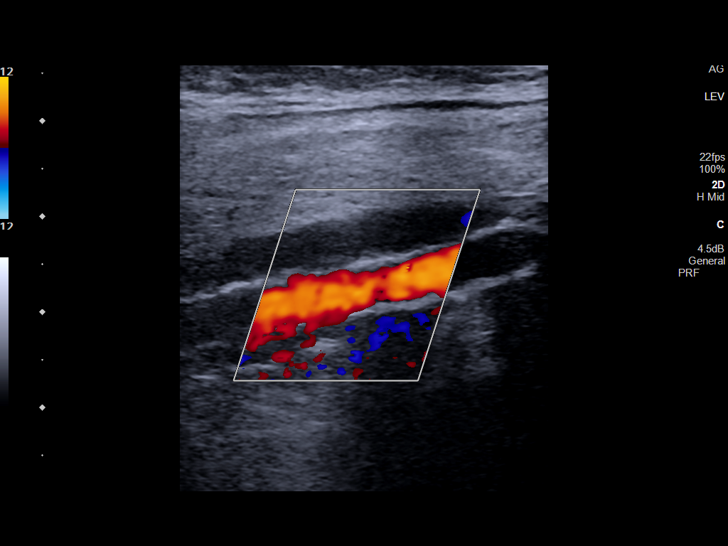

[13 of 24 positions shown; findings below may reference images not displayed]

FINDINGS: VENOUS

Substantial bilateral deep vein thrombosis involving the common
femoral veins, superficial femoral vein, profundus femoral vein,
popliteal veins, and calf veins. For the most part the thrombus is
nonocclusive with color flow visible adjacent to the thrombus
although there is some occlusive thrombus in the left popliteal and
posterior tibial vein.

Limitations: none
IMPRESSION: 1. Extensive bilateral deep vein thrombosis in the lower
extremities. This is mostly nonocclusive although there is occlusive
thrombus in the left popliteal vein and posterior tibial vein.

Radiology assistant personnel have been notified to put me in
telephone contact with the referring physician or the referring
physician's clinical representative in order to discuss these
findings. Once this communication is established I will issue an
addendum to this report for documentation purposes.

ADDENDUM:
The original report was by Dr. Feiereisen Az. The following
addendum is by Dr. Feiereisen Az:

Critical Value/emergent results were called by telephone at the time
of interpretation on 07/26/2020 at [DATE] to provider Dr. TAULE
MATTEY , who verbally acknowledged these results.

*** End of Addendum ***
FINDINGS: VENOUS

Substantial bilateral deep vein thrombosis involving the common
femoral veins, superficial femoral vein, profundus femoral vein,
popliteal veins, and calf veins. For the most part the thrombus is
nonocclusive with color flow visible adjacent to the thrombus
although there is some occlusive thrombus in the left popliteal and
posterior tibial vein.

Limitations: none
IMPRESSION: 1. Extensive bilateral deep vein thrombosis in the lower
extremities. This is mostly nonocclusive although there is occlusive
thrombus in the left popliteal vein and posterior tibial vein.

Radiology assistant personnel have been notified to put me in
telephone contact with the referring physician or the referring
physician's clinical representative in order to discuss these
findings. Once this communication is established I will issue an
addendum to this report for documentation purposes.
